# Patient Record
Sex: Female | Born: 1985
Health system: Southern US, Community
[De-identification: ages and names within clinical notes are randomized; demographics above are authoritative.]

## PROBLEM LIST (undated history)

## (undated) DIAGNOSIS — R87629 Unspecified abnormal cytological findings in specimens from vagina: Secondary | ICD-10-CM

## (undated) HISTORY — DX: Unspecified abnormal cytological findings in specimens from vagina: R87.629

## (undated) HISTORY — PX: WISDOM TOOTH EXTRACTION: SHX21

---
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
## 2005-02-01 ENCOUNTER — Ambulatory Visit (HOSPITAL_COMMUNITY): Admission: RE | Admit: 2005-02-01 | Discharge: 2005-02-01 | Payer: Self-pay | Admitting: Obstetrics & Gynecology

## 2005-02-21 ENCOUNTER — Emergency Department (HOSPITAL_COMMUNITY): Admission: EM | Admit: 2005-02-21 | Discharge: 2005-02-21 | Payer: Self-pay | Admitting: Emergency Medicine

## 2005-02-21 ENCOUNTER — Emergency Department (HOSPITAL_COMMUNITY): Admission: EM | Admit: 2005-02-21 | Discharge: 2005-02-22 | Payer: Self-pay | Admitting: Emergency Medicine

## 2005-03-30 ENCOUNTER — Ambulatory Visit (HOSPITAL_COMMUNITY): Admission: RE | Admit: 2005-03-30 | Discharge: 2005-03-30 | Payer: Self-pay | Admitting: Obstetrics & Gynecology

## 2005-05-09 ENCOUNTER — Inpatient Hospital Stay (HOSPITAL_COMMUNITY): Admission: AD | Admit: 2005-05-09 | Discharge: 2005-05-09 | Payer: Self-pay | Admitting: Obstetrics

## 2005-07-03 ENCOUNTER — Inpatient Hospital Stay (HOSPITAL_COMMUNITY): Admission: AD | Admit: 2005-07-03 | Discharge: 2005-07-03 | Payer: Self-pay | Admitting: Obstetrics

## 2005-09-04 ENCOUNTER — Inpatient Hospital Stay (HOSPITAL_COMMUNITY): Admission: AD | Admit: 2005-09-04 | Discharge: 2005-09-04 | Payer: Self-pay | Admitting: Obstetrics

## 2005-09-05 ENCOUNTER — Inpatient Hospital Stay (HOSPITAL_COMMUNITY): Admission: AD | Admit: 2005-09-05 | Discharge: 2005-09-08 | Payer: Self-pay | Admitting: Obstetrics

## 2005-09-10 ENCOUNTER — Inpatient Hospital Stay (HOSPITAL_COMMUNITY): Admission: AD | Admit: 2005-09-10 | Discharge: 2005-09-10 | Payer: Self-pay | Admitting: Obstetrics

## 2006-12-04 ENCOUNTER — Inpatient Hospital Stay (HOSPITAL_COMMUNITY): Admission: AD | Admit: 2006-12-04 | Discharge: 2006-12-04 | Payer: Self-pay | Admitting: Obstetrics

## 2007-09-09 ENCOUNTER — Emergency Department (HOSPITAL_COMMUNITY): Admission: EM | Admit: 2007-09-09 | Discharge: 2007-09-09 | Payer: Self-pay | Admitting: Emergency Medicine

## 2008-04-26 ENCOUNTER — Emergency Department (HOSPITAL_COMMUNITY): Admission: EM | Admit: 2008-04-26 | Discharge: 2008-04-26 | Payer: Self-pay | Admitting: Emergency Medicine

## 2008-06-16 ENCOUNTER — Emergency Department (HOSPITAL_COMMUNITY): Admission: EM | Admit: 2008-06-16 | Discharge: 2008-06-16 | Payer: Self-pay | Admitting: Emergency Medicine

## 2008-09-19 ENCOUNTER — Emergency Department (HOSPITAL_COMMUNITY): Admission: EM | Admit: 2008-09-19 | Discharge: 2008-09-19 | Payer: Self-pay | Admitting: Emergency Medicine

## 2008-11-22 ENCOUNTER — Emergency Department (HOSPITAL_COMMUNITY): Admission: EM | Admit: 2008-11-22 | Discharge: 2008-11-22 | Payer: Self-pay | Admitting: Emergency Medicine

## 2009-02-25 ENCOUNTER — Inpatient Hospital Stay (HOSPITAL_COMMUNITY): Admission: AD | Admit: 2009-02-25 | Discharge: 2009-02-25 | Payer: Self-pay | Admitting: Obstetrics & Gynecology

## 2009-04-14 ENCOUNTER — Ambulatory Visit (HOSPITAL_COMMUNITY): Admission: RE | Admit: 2009-04-14 | Discharge: 2009-04-14 | Payer: Self-pay | Admitting: Obstetrics

## 2009-06-15 ENCOUNTER — Inpatient Hospital Stay (HOSPITAL_COMMUNITY): Admission: AD | Admit: 2009-06-15 | Discharge: 2009-06-15 | Payer: Self-pay | Admitting: Obstetrics and Gynecology

## 2009-06-23 ENCOUNTER — Encounter: Admission: RE | Admit: 2009-06-23 | Discharge: 2009-06-23 | Payer: Self-pay | Admitting: Obstetrics and Gynecology

## 2009-08-24 ENCOUNTER — Inpatient Hospital Stay (HOSPITAL_COMMUNITY): Admission: AD | Admit: 2009-08-24 | Discharge: 2009-08-26 | Payer: Self-pay | Admitting: Obstetrics and Gynecology

## 2010-03-13 ENCOUNTER — Encounter: Payer: Self-pay | Admitting: Obstetrics & Gynecology

## 2010-03-14 ENCOUNTER — Encounter: Payer: Self-pay | Admitting: Obstetrics and Gynecology

## 2010-05-09 LAB — CBC
HCT: 35.3 % — ABNORMAL LOW (ref 36.0–46.0)
Hemoglobin: 12.1 g/dL (ref 12.0–15.0)
MCH: 30.9 pg (ref 26.0–34.0)
MCHC: 34.5 g/dL (ref 30.0–36.0)
MCV: 90.1 fL (ref 78.0–100.0)
Platelets: 195 10*3/uL (ref 150–400)
RBC: 3.6 MIL/uL — ABNORMAL LOW (ref 3.87–5.11)
RBC: 3.92 MIL/uL (ref 3.87–5.11)
RDW: 12.9 % (ref 11.5–15.5)
RDW: 13.3 % (ref 11.5–15.5)
WBC: 8.8 10*3/uL (ref 4.0–10.5)

## 2010-05-09 LAB — URINALYSIS, ROUTINE W REFLEX MICROSCOPIC
Hgb urine dipstick: NEGATIVE
Ketones, ur: NEGATIVE mg/dL
Nitrite: NEGATIVE
Protein, ur: NEGATIVE mg/dL
Specific Gravity, Urine: 1.015 (ref 1.005–1.030)
pH: 8 (ref 5.0–8.0)

## 2010-05-09 LAB — WET PREP, GENITAL

## 2010-05-09 LAB — GC/CHLAMYDIA PROBE AMP, GENITAL: Chlamydia, DNA Probe: NEGATIVE

## 2010-05-11 LAB — URINE CULTURE

## 2010-05-11 LAB — URINALYSIS, ROUTINE W REFLEX MICROSCOPIC
Bilirubin Urine: NEGATIVE
Glucose, UA: NEGATIVE mg/dL
Specific Gravity, Urine: >1.03 — ABNORMAL HIGH (ref 1.005–1.030)
Urobilinogen, UA: 0.2 mg/dL (ref 0.0–1.0)

## 2010-05-11 LAB — URINE MICROSCOPIC-ADD ON

## 2010-05-27 LAB — URINALYSIS, ROUTINE W REFLEX MICROSCOPIC
Nitrite: NEGATIVE
Specific Gravity, Urine: 1.022 (ref 1.005–1.030)
Urobilinogen, UA: 4 mg/dL — ABNORMAL HIGH (ref 0.0–1.0)

## 2010-05-27 LAB — URINE CULTURE: Colony Count: 4000

## 2010-05-27 LAB — POCT PREGNANCY, URINE: Preg Test, Ur: NEGATIVE

## 2010-05-27 LAB — URINE MICROSCOPIC-ADD ON

## 2010-06-02 LAB — PREGNANCY, URINE: Preg Test, Ur: NEGATIVE

## 2010-06-02 LAB — POCT I-STAT, CHEM 8
BUN: 17 mg/dL (ref 6–23)
Creatinine, Ser: 0.8 mg/dL (ref 0.4–1.2)

## 2010-07-27 ENCOUNTER — Emergency Department (HOSPITAL_COMMUNITY)
Admission: EM | Admit: 2010-07-27 | Discharge: 2010-07-27 | Disposition: A | Discharge status: Home / Self Care | Payer: Self-pay | Attending: Emergency Medicine | Admitting: Emergency Medicine

## 2010-07-27 ENCOUNTER — Emergency Department (HOSPITAL_COMMUNITY): Payer: Self-pay

## 2010-07-27 DIAGNOSIS — R51 Headache: Secondary | ICD-10-CM | POA: Insufficient documentation

## 2010-07-27 DIAGNOSIS — R42 Dizziness and giddiness: Secondary | ICD-10-CM | POA: Insufficient documentation

## 2010-07-27 LAB — URINE MICROSCOPIC-ADD ON

## 2010-07-27 LAB — URINALYSIS, ROUTINE W REFLEX MICROSCOPIC
Bilirubin Urine: NEGATIVE
Ketones, ur: 15 mg/dL — AB
Leukocytes, UA: NEGATIVE
Nitrite: NEGATIVE
Specific Gravity, Urine: 1.028 (ref 1.005–1.030)
Urobilinogen, UA: 1 mg/dL (ref 0.0–1.0)

## 2010-07-28 LAB — POCT I-STAT, CHEM 8
Calcium, Ion: 1.22 mmol/L (ref 1.12–1.32)
Creatinine, Ser: 0.8 mg/dL (ref 0.4–1.2)
TCO2: 28 mmol/L (ref 0–100)

## 2011-02-13 ENCOUNTER — Encounter: Payer: Self-pay | Admitting: *Deleted

## 2011-02-13 ENCOUNTER — Emergency Department (HOSPITAL_COMMUNITY)
Admission: EM | Admit: 2011-02-13 | Discharge: 2011-02-14 | Disposition: A | Discharge status: Home / Self Care | Payer: Self-pay | Attending: Emergency Medicine | Admitting: Emergency Medicine

## 2011-02-13 DIAGNOSIS — F411 Generalized anxiety disorder: Secondary | ICD-10-CM | POA: Insufficient documentation

## 2011-02-13 DIAGNOSIS — T391X1A Poisoning by 4-Aminophenol derivatives, accidental (unintentional), initial encounter: Secondary | ICD-10-CM | POA: Insufficient documentation

## 2011-02-13 DIAGNOSIS — T50901A Poisoning by unspecified drugs, medicaments and biological substances, accidental (unintentional), initial encounter: Secondary | ICD-10-CM

## 2011-02-13 DIAGNOSIS — R51 Headache: Secondary | ICD-10-CM | POA: Insufficient documentation

## 2011-02-13 LAB — DIFFERENTIAL
Basophils Absolute: 0 10*3/uL (ref 0.0–0.1)
Basophils Relative: 1 % (ref 0–1)
Lymphocytes Relative: 38 % (ref 12–46)
Neutro Abs: 3.7 10*3/uL (ref 1.7–7.7)

## 2011-02-13 LAB — COMPREHENSIVE METABOLIC PANEL
ALT: 9 U/L (ref 0–35)
AST: 14 U/L (ref 0–37)
Albumin: 4.3 g/dL (ref 3.5–5.2)
CO2: 26 mEq/L (ref 19–32)
Calcium: 9.6 mg/dL (ref 8.4–10.5)
Chloride: 102 mEq/L (ref 96–112)
GFR calc non Af Amer: >90 mL/min (ref >90)
Sodium: 138 mEq/L (ref 135–145)

## 2011-02-13 LAB — RAPID URINE DRUG SCREEN, HOSP PERFORMED
Amphetamines: NOT DETECTED
Benzodiazepines: NOT DETECTED
Opiates: POSITIVE — AB
Tetrahydrocannabinol: NOT DETECTED

## 2011-02-13 LAB — POCT I-STAT 3, VENOUS BLOOD GAS (G3P V)
Acid-base deficit: 2 mmol/L (ref 0.0–2.0)
O2 Saturation: 27 %
Patient temperature: 37
TCO2: 25 mmol/L (ref 0–100)
pCO2, Ven: 45.2 mmHg (ref 45.0–50.0)

## 2011-02-13 LAB — CBC
Platelets: 276 10*3/uL (ref 150–400)
RDW: 12.2 % (ref 11.5–15.5)
WBC: 6.6 10*3/uL (ref 4.0–10.5)

## 2011-02-13 MED ORDER — SODIUM CHLORIDE 0.9 % IV BOLUS (SEPSIS)
1000.0000 mL | Freq: Once | INTRAVENOUS | Status: AC
  Administered 2011-02-13: 1000 mL via INTRAVENOUS

## 2011-02-13 NOTE — ED Provider Notes (Signed)
Medical screening examination/treatment/procedure(s) were performed by non-physician practitioner and as supervising physician I was immediately available for consultation/collaboration.   Lyanne Co, MD 02/13/11 [phone]

## 2011-02-13 NOTE — BH Assessment (Signed)
Assessment Note   Janet French is an 25 y.o. female. PT PRESENTS FOR MEDICAL CLEARANCE AFTER TAKING 8 VICODINE CLAIMING SHE WANTED TO GO TO SLEEP. PT HAS A FLAT AFFECT, DENIES CURRENT IDEATION EVENTHOUGH SHE ADMITS TAKING BOYFRIENDS VICODINE AFTER RE-OCCURING DAILY ARGUMENT. PT ADMITS TO DEPRESSION BUT DENIES CURRENTLY OR EVER SEEING SOMEONE. PT DENIES PRIOR ATTEMPTS OR HI OR AV. PT IS NOT MEDICALLY CLEARED & ONCE MEDICALLY CLEARED MIGHT NEED A TELEPSYCH TO DETERMINE DISPOSITION. CLINICIAN IS CONCERNED & BELIEVES THIS WAS A SUICIDE ATTEMPT WHICH PT IS DENYING.  Axis I: Major Depression, single episode Axis II: Deferred Axis III: History reviewed. No pertinent past medical history. Axis IV: other psychosocial or environmental problems, problems related to social environment and problems with primary support group Axis V: 11-20 some danger of hurting self or others possible OR occasionally fails to maintain minimal personal hygiene OR gross impairment in communication  Past Medical History: History reviewed. No pertinent past medical history.  History reviewed. No pertinent past surgical history.  Family History:  Family History  Problem Relation Age of Onset  . Cancer Other     Social History:  reports that she has never smoked. She does not have any smokeless tobacco history on file. She reports that she does not drink alcohol or use illicit drugs.  Additional Social History:    Allergies: No Known Allergies  Home Medications:  Medications Prior to Admission  Medication Dose Route Frequency Provider Last Rate Last Dose  . sodium chloride 0.9 % bolus 1,000 mL  1,000 mL Intravenous Once Arman Filter, NP   1,000 mL at 02/13/11 2201   No current outpatient prescriptions on file as of 02/13/2011.    OB/GYN Status:  No LMP recorded. Patient has had an injection.  General Assessment Data Location of Assessment: [facility] ED ACT Assessment: Yes Living Arrangements: Non-Relatives Can pt  return to current living arrangement?: Yes Admission Status: Voluntary Is patient capable of signing voluntary admission?: Yes Transfer from: Acute Hospital Referral Source: Self/Family/Friend     Risk to self Suicidal Ideation: Yes-Currently Present Suicidal Intent: Yes-Currently Present Is patient at risk for suicide?: Yes Suicidal Plan?: Yes-Currently Present Specify Current Suicidal Plan: TOOK 8 VICODINE Access to Means: Yes Specify Access to Suicidal Means: 8 VICODINES What has been your use of drugs/alcohol within the last 12 months?: NA Previous Attempts/Gestures: No How many times?: 0  Other Self Harm Risks: NA Triggers for Past Attempts: Other personal contacts Intentional Self Injurious Behavior: None Family Suicide History: Unknown Recent stressful life event(s): Conflict (Comment) Persecutory voices/beliefs?: No Depression: Yes Depression Symptoms: Loss of interest in usual pleasures;Feeling worthless/self pity;Isolating Substance abuse history and/or treatment for substance abuse?: No Suicide prevention information given to non-admitted patients: Not applicable  Risk to Others Homicidal Ideation: No Thoughts of Harm to Others: No Current Homicidal Intent: No Current Homicidal Plan: No Access to Homicidal Means: No Identified Victim: NA History of harm to others?: No Assessment of Violence: None Noted Violent Behavior Description: CALM FLAT, COOPERATIVE Does patient have access to weapons?: No Criminal Charges Pending?: No Does patient have a court date: No  Psychosis Hallucinations: None noted Delusions: None noted  Mental Status Report Appear/Hygiene: Improved Eye Contact: Fair Motor Activity: Unremarkable Speech: Soft Level of Consciousness: Alert Mood: Depressed;Anhedonia;Empty Affect: Depressed Anxiety Level: None Thought Processes: Coherent;Relevant Judgement: Unimpaired Orientation: Person;Place;Time;Situation Obsessive Compulsive  Thoughts/Behaviors: None  Cognitive Functioning Concentration: Decreased Memory: Recent Intact;Remote Intact IQ: Average Insight: Poor Impulse Control: Poor Appetite: Poor Weight  Loss: 0  Weight Gain: 0  Sleep: Decreased Total Hours of Sleep: 8  Vegetative Symptoms: None  Prior Inpatient Therapy Prior Inpatient Therapy: No Prior Therapy Dates: NA Prior Therapy Facilty/Provider(s): NA Reason for Treatment: NA  Prior Outpatient Therapy Prior Outpatient Therapy: No Prior Therapy Dates: NA Prior Therapy Facilty/Provider(s): NA Reason for Treatment: NA            Values / Beliefs Cultural Requests During Hospitalization: None Spiritual Requests During Hospitalization: None        Additional Information 1:1 In Past 12 Months?: No CIRT Risk: No Elopement Risk: No Does patient have medical clearance?: No     Disposition:  Disposition Disposition of Patient: Inpatient treatment program;Referred to (NOT MEDICAL CLEARED) Type of inpatient treatment program: Adult  On Site Evaluation by:   Reviewed with Physician:     Waldron Session 02/13/2011 10:01 PM

## 2011-02-13 NOTE — ED Notes (Addendum)
C/o pain everywhere, lists: head, L knee & chest. Onset 1830. Took 8 hydrocodone 5/325 at 1945 to treat pain, intent was to treat pain & "just wanted to go to sleep, but not for ever". "H/o migraines and bad knee". (denies: other drugs, medications ETOH, hallucinations or SI), (denies nv or other sx).

## 2011-02-13 NOTE — ED Notes (Addendum)
[facility] called. Spoke with [place].

## 2011-02-13 NOTE — ED Provider Notes (Addendum)
History     CSN: [id number]  Arrival date & time 02/13/11  2019   First MD Initiated Contact with Patient 02/13/11 2128      Chief Complaint  Patient presents with  . Drug Overdose    took 8 at one time, taken at ~1945, taken for pain everywhere    (Consider location/radiation/quality/duration/timing/severity/associated sxs/prior treatment) HPI Comments: Patient states she took 6-8 Vicodin tablets that were not her room for headache.  Because she wanted it to work faster, makes poor eye contact.  Denies suicidality, states she just wanted to sleep  Patient is a 25 y.o. female presenting with Overdose. The history is provided by the patient.  Drug Overdose This is a new problem. The current episode started today. Associated symptoms include headaches. The symptoms are aggravated by nothing. She has tried nothing for the symptoms. The treatment provided no relief.    History reviewed. No pertinent past medical history.  History reviewed. No pertinent past surgical history.  Family History  Problem Relation Age of Onset  . Cancer Other     History  Substance Use Topics  . Smoking status: Never Smoker   . Smokeless tobacco: Not on file  . Alcohol Use: No    OB History    Grav Para Term Preterm Abortions TAB SAB Ect Mult Living                  Review of Systems  Constitutional: Negative.   Eyes: Negative.   Respiratory: Negative.   Cardiovascular: Negative.   Genitourinary: Negative.   Neurological: Positive for headaches. Negative for dizziness.  Hematological: Negative.   Psychiatric/Behavioral: Negative for suicidal ideas, behavioral problems and agitation. The patient is nervous/anxious.     Allergies  Review of patient's allergies indicates no known allergies.  Home Medications   Current Outpatient Rx  Name Route Sig Dispense Refill  . HYDROCODONE-ACETAMINOPHEN 5-325 MG PO TABS Oral Take 1 tablet by mouth every 6 (six) hours as needed. For pain.     Marland Kitchen  DEPO-PROVERA IM Intramuscular Inject 1 Syringe into the muscle every 30 (thirty) days. Had monthly injection on wed 12/19       BP 125/72  Pulse 92  Temp(Src) 98.3 F (36.8 C) (Oral)  Resp 18  SpO2 100%  Physical Exam  Constitutional: She is oriented to person, place, and time. She appears well-developed and well-nourished.  HENT:  Head: Normocephalic.  Eyes: Pupils are equal, round, and reactive to light.  Cardiovascular: Normal rate.   Pulmonary/Chest: Effort normal.  Musculoskeletal: Normal range of motion.  Neurological: She is oriented to person, place, and time.  Skin: Skin is warm.  Psychiatric: Thought content normal. Cognition and memory are not impaired. She expresses inappropriate judgment. She expresses no suicidal ideation. She expresses no suicidal plans and no homicidal plans. She exhibits normal recent memory.    ED Course  Procedures (including critical care time)  Labs Reviewed  CBC - Abnormal; Notable for the following:    HCT 35.1 (*)    All other components within normal limits  SALICYLATE LEVEL - Abnormal; Notable for the following:    Salicylate Lvl <2.0 (*)    All other components within normal limits  URINE RAPID DRUG SCREEN (HOSP PERFORMED) - Abnormal; Notable for the following:    Opiates POSITIVE (*)    All other components within normal limits  POCT I-STAT 3, BLOOD GAS (G3P V) - Abnormal; Notable for the following:    pH, Ven 7.329 (*)  pO2, Ven 19.0 (*)    All other components within normal limits  DIFFERENTIAL  COMPREHENSIVE METABOLIC PANEL  ACETAMINOPHEN LEVEL  ACETAMINOPHEN LEVEL  BLOOD GAS, VENOUS   No results found.   1. Drug overdose    Tell psych evaluation has been performed psychiatrist called and reports he finds no reason to admit this patien,t feels that she is safe to return to her home.    MDM  We'll check drug levels including Tylenol, per patient history.  She took 6-8 Vicodin tablets with 325 mg Tylenol, each.   Will have patient assess by asked him as well to to her poor eye contact and they history giving  Ms. Turcott was seen and assessed by activity and who feels that this was a suicide attempt.  Reports the patient stated she had an argument with her boyfriend, which preceded the consumption of the boyfriends of Vicodin.  Will transfer patient to [place] with a sitter.  Still awaiting Tylenol and salicylate levels.  After these have returned.  Will consult with [facility] psych for further input.      Arman Filter, NP 02/13/11 2207  Arman Filter, NP 02/13/11 2317  Arman Filter, NP 02/14/11 4540  Arman Filter, NP 02/14/11 [phone]

## 2011-02-14 NOTE — ED Provider Notes (Signed)
Medical screening examination/treatment/procedure(s) were performed by non-physician practitioner and as supervising physician I was immediately available for consultation/collaboration.   Zenia Guest M Miangel Flom, DO 02/14/11 0929 

## 2011-04-30 DIAGNOSIS — R1031 Right lower quadrant pain: Secondary | ICD-10-CM | POA: Insufficient documentation

## 2011-04-30 DIAGNOSIS — N201 Calculus of ureter: Secondary | ICD-10-CM | POA: Insufficient documentation

## 2011-05-01 ENCOUNTER — Emergency Department (HOSPITAL_COMMUNITY)
Admission: EM | Admit: 2011-05-01 | Discharge: 2011-05-01 | Disposition: A | Discharge status: Home / Self Care | Payer: Self-pay | Attending: Emergency Medicine | Admitting: Emergency Medicine

## 2011-05-01 ENCOUNTER — Emergency Department (HOSPITAL_COMMUNITY): Payer: Self-pay

## 2011-05-01 ENCOUNTER — Encounter (HOSPITAL_COMMUNITY): Payer: Self-pay | Admitting: *Deleted

## 2011-05-01 DIAGNOSIS — N201 Calculus of ureter: Secondary | ICD-10-CM

## 2011-05-01 LAB — URINE MICROSCOPIC-ADD ON

## 2011-05-01 LAB — POCT I-STAT, CHEM 8
BUN: 18 mg/dL (ref 6–23)
Calcium, Ion: 1.28 mmol/L (ref 1.12–1.32)
Chloride: 105 mEq/L (ref 96–112)
Creatinine, Ser: 0.8 mg/dL (ref 0.50–1.10)
Glucose, Bld: 106 mg/dL — ABNORMAL HIGH (ref 70–99)
TCO2: 27 mmol/L (ref 0–100)

## 2011-05-01 LAB — URINALYSIS, ROUTINE W REFLEX MICROSCOPIC
Bilirubin Urine: NEGATIVE
Glucose, UA: NEGATIVE mg/dL
Ketones, ur: NEGATIVE mg/dL
Protein, ur: NEGATIVE mg/dL
Urobilinogen, UA: 1 mg/dL (ref 0.0–1.0)

## 2011-05-01 MED ORDER — OXYCODONE-ACETAMINOPHEN 5-325 MG PO TABS: 2.0000 | ORAL_TABLET | ORAL | Status: AC | PRN

## 2011-05-01 MED ORDER — IBUPROFEN 200 MG PO TABS
600.0000 mg | ORAL_TABLET | Freq: Once | ORAL | Status: AC
  Administered 2011-05-01: 600 mg via ORAL

## 2011-05-01 MED ORDER — IBUPROFEN 200 MG PO TABS
ORAL_TABLET | ORAL | Status: AC
  Administered 2011-05-01: 600 mg via ORAL
  Filled 2011-05-01: qty 3

## 2011-05-01 MED ORDER — ONDANSETRON HCL 4 MG PO TABS: 4.0000 mg | ORAL_TABLET | Freq: Four times a day (QID) | ORAL | Status: AC

## 2011-05-01 MED ORDER — IBUPROFEN 800 MG PO TABS: 800.0000 mg | ORAL_TABLET | Freq: Once | ORAL | Status: DC

## 2011-05-01 NOTE — ED Notes (Signed)
Patient is resting comfortably. 

## 2011-05-01 NOTE — ED Provider Notes (Signed)
History     CSN: [id number]  Arrival date & time 04/30/11  2355   First MD Initiated Contact with Patient 05/01/11 0149      Chief Complaint  Patient presents with  . Abdominal Pain  . Nausea  . Emesis    (Consider location/radiation/quality/duration/timing/severity/associated sxs/prior treatment) HPI Right flank pain started around 11 PM. Sharp in quality and not radiating. She has not noticed any blood or urine. She denies any fevers or chills. She's had some nausea but no vomiting. No history of same. No history of kidney stones. No history of gallbladder problems. Had dinner about 4 hours earlier and does not feel like her symptoms are related to eating a hamburger. No known aggravating or alleviating factors. No migrating pain. No back pain. Moderate in severity. On evaluation her pain is improving and she declines any medications. History reviewed. No pertinent past medical history.  History reviewed. No pertinent past surgical history.  Family History  Problem Relation Age of Onset  . Cancer Other     History  Substance Use Topics  . Smoking status: Never Smoker   . Smokeless tobacco: Not on file  . Alcohol Use: Yes     occasionally    OB History    Grav Para Term Preterm Abortions TAB SAB Ect Mult Living                  Review of Systems  Constitutional: Negative for fever and chills.  HENT: Negative for neck pain and neck stiffness.   Eyes: Negative for pain.  Respiratory: Negative for shortness of breath.   Cardiovascular: Negative for chest pain.  Gastrointestinal: Negative for abdominal pain.  Genitourinary: Positive for flank pain. Negative for dysuria.  Musculoskeletal: Negative for back pain.  Skin: Negative for rash.  Neurological: Negative for headaches.  All other systems reviewed and are negative.    Allergies  Review of patient's allergies indicates no known allergies.  Home Medications   Current Outpatient Rx  Name Route Sig Dispense  Refill  . MEDROXYPROGESTERONE ACETATE 150 MG/ML IM SUSP Intramuscular Inject 150 mg into the muscle every 3 (three) months.      BP 141/92  Pulse 85  Temp 98.6 F (37 C)  Resp 20  SpO2 100%  Physical Exam  Constitutional: She is oriented to person, place, and time. She appears well-developed and well-nourished.  HENT:  Head: Normocephalic and atraumatic.  Eyes: Conjunctivae and EOM are normal. Pupils are equal, round, and reactive to light.  Neck: Trachea normal. Neck supple. No thyromegaly present.  Cardiovascular: Normal rate, regular rhythm, S1 normal, S2 normal and normal pulses.     No systolic murmur is present   No diastolic murmur is present  Pulses:      Radial pulses are 2+ on the right side, and 2+ on the left side.  Pulmonary/Chest: Effort normal and breath sounds normal. She has no wheezes. She has no rhonchi. She has no rales. She exhibits no tenderness.  Abdominal: Soft. Normal appearance and bowel sounds are normal. There is no tenderness. There is no CVA tenderness and negative Murphy's sign.       Localizes discomfort to right flank without any reproducible tenderness. No peritonitis.  Musculoskeletal:       BLE:s Calves nontender, no cords or erythema, negative Homans sign  Neurological: She is alert and oriented to person, place, and time. She has normal strength. No cranial nerve deficit or sensory deficit. GCS eye subscore is 4. GCS  verbal subscore is 5. GCS motor subscore is 6.  Skin: Skin is warm and dry. No rash noted. She is not diaphoretic.  Psychiatric: Her speech is normal.       Cooperative and appropriate    ED Course  Procedures (including critical care time)  Labs Reviewed  URINALYSIS, ROUTINE W REFLEX MICROSCOPIC - Abnormal; Notable for the following:    APPearance CLOUDY (*)    Hgb urine dipstick MODERATE (*)    All other components within normal limits  POCT I-STAT, CHEM 8 - Abnormal; Notable for the following:    Glucose, Bld 106 (*)     All other components within normal limits  URINE MICROSCOPIC-ADD ON - Abnormal; Notable for the following:    Squamous Epithelial / LPF FEW (*)    All other components within normal limits  POCT PREGNANCY, URINE   Ct Abdomen Pelvis Wo Contrast  05/01/2011  *RADIOLOGY REPORT*  Clinical Data: Right lower quadrant abdominal pain  CT ABDOMEN AND PELVIS WITHOUT CONTRAST  Technique:  Multidetector CT imaging of the abdomen and pelvis was performed following the standard protocol without intravenous contrast.  Comparison: None.  Findings: Limited images through the lung bases demonstrate no significant appreciable abnormality. The heart size is within normal limits. No pleural or pericardial effusion.  Intra-abdominal organ evaluation is limited without intravenous contrast.  Within this limitation, unremarkable liver, biliary system, spleen, pancreas, adrenal glands.  There is a right nonobstructing renal stone. No hydronephrosis or hydroureter. Unable to follow the ureteral course in their entirety however no calcifications along the expected course.  There is a 3 mm calcification which appears to be layering dependently within the bladder and may have recently passed.  No bowel obstruction.  No CT evidence for colitis.  Normal appendix.  No free intraperitoneal air or fluid.  Uterus and adnexa within normal limits for noncontrast CT.  Trace free fluid within the pelvis is likely physiologic.  No acute osseous abnormality identified.  IMPRESSION: Nonobstructing right renal stone.  No hydronephrosis or hydroureter.  There is a 3 mm stone within the bladder, which may have recently passed.  Normal appendix.  Original Report Authenticated By: Waneta Martins, M.D.    Ibuprofen provided by request. He'll exams no peritonitis    MDM   Right flank pain with UA reviewed as above hematuria noted. CT scan ordered and reviewed as above with right ureteral stone passed into the bladder. Recheck at 7:10 AM is  feeling much better and feels comes will be discharged home. Urology referral provided as needed. Reliable historian verbalizes understanding return precautions.         Sunnie Nielsen, MD 05/01/11 [phone]

## 2011-05-01 NOTE — Discharge Instructions (Signed)
Ureteral Colic (Kidney Stones) Ureteral colic is the result of a condition when kidney stones form inside the kidney. Once kidney stones are formed they may move into the tube that connects the kidney with the bladder (ureter). If this occurs, this condition may cause pain (colic) in the ureter.  CAUSES  Pain is caused by stone movement in the ureter and the obstruction caused by the stone. SYMPTOMS  The pain comes and goes as the ureter contracts around the stone. The pain is usually intense, sharp, and stabbing in character. The location of the pain may move as the stone moves through the ureter. When the stone is near the kidney the pain is usually located in the back and radiates to the belly (abdomen). When the stone is ready to pass into the bladder the pain is often located in the lower abdomen on the side the stone is located. At this location, the symptoms may mimic those of a urinary tract infection with urinary frequency. Once the stone is located here it often passes into the bladder and the pain disappears completely. TREATMENT   Your caregiver will provide you with medicine for pain relief.   You may require specialized follow-up X-rays.   The absence of pain does not always mean that the stone has passed. It may have just stopped moving. If the urine remains completely obstructed, it can cause loss of kidney function or even complete destruction of the involved kidney. It is your responsibility and in your interest that X-rays and follow-ups as suggested by your caregiver are completed. Relief of pain without passage of the stone can be associated with severe damage to the kidney, including loss of kidney function on that side.   If your stone does not pass on its own, additional measures may be taken by your caregiver to ensure its removal.  HOME CARE INSTRUCTIONS   Increase your fluid intake. Water is the preferred fluid since juices containing vitamin C may acidify the urine making  it less likely for certain stones (uric acid stones) to pass.   Strain all urine. A strainer will be provided. Keep all particulate matter or stones for your caregiver to inspect.   Take your pain medicine as directed.   Make a follow-up appointment with your caregiver as directed.   Remember that the goal is passage of your stone. The absence of pain does not mean the stone is gone. Follow your caregiver's instructions.   Only take over-the-counter or prescription medicines for pain, discomfort, or fever as directed by your caregiver.  SEEK MEDICAL CARE IF:   Pain cannot be controlled with the prescribed medicine.   You have a fever.   Pain continues for longer than your caregiver advises it should.   There is a change in the pain, and you develop chest discomfort or constant abdominal pain.   You feel faint or pass out.  MAKE SURE YOU:   Understand these instructions.   Will watch your condition.   Will get help right away if you are not doing well or get worse.  Document Released: 11/17/2004 Document Revised: 01/27/2011 Document Reviewed: 08/04/2010 [employer] Patient Information 2012 [employer]. 

## 2011-05-01 NOTE — ED Notes (Signed)
Pt states that she has had nausea and abdominal cramping x 2 hours.  Pt denies vomitus or diarrhea.  Pt denies other symptoms and states that her appetite is per her norm.

## 2011-05-01 NOTE — ED Notes (Signed)
Pt c/o nausea starting at 2300, dry heaves. Sharp abdominal pain in RLQ at same time.

## 2011-09-26 ENCOUNTER — Encounter (HOSPITAL_COMMUNITY): Payer: Self-pay

## 2011-09-26 ENCOUNTER — Emergency Department (HOSPITAL_COMMUNITY)
Admission: EM | Admit: 2011-09-26 | Discharge: 2011-09-26 | Disposition: A | Discharge status: Home / Self Care | Payer: Self-pay | Attending: Emergency Medicine | Admitting: Emergency Medicine

## 2011-09-26 DIAGNOSIS — J020 Streptococcal pharyngitis: Secondary | ICD-10-CM | POA: Insufficient documentation

## 2011-09-26 DIAGNOSIS — Z79899 Other long term (current) drug therapy: Secondary | ICD-10-CM | POA: Insufficient documentation

## 2011-09-26 MED ORDER — PENICILLIN G BENZATHINE 1200000 UNIT/2ML IM SUSP
1.2000 10*6.[IU] | Freq: Once | INTRAMUSCULAR | Status: AC
  Administered 2011-09-26: 1.2 10*6.[IU] via INTRAMUSCULAR
  Filled 2011-09-26: qty 2

## 2011-09-26 NOTE — ED Notes (Signed)
ongong sore throat and body aches since Friday.

## 2011-09-26 NOTE — ED Provider Notes (Signed)
Medical screening examination/treatment/procedure(s) were performed by non-physician practitioner and as supervising physician I was immediately available for consultation/collaboration.   Rolan Bucco, MD 09/26/11 [phone]

## 2011-09-26 NOTE — ED Provider Notes (Signed)
History     CSN: [id number]  Arrival date & time 09/26/11  1155   First MD Initiated Contact with Patient 09/26/11 1310      Chief Complaint  Patient presents with  . Sore Throat    (Consider location/radiation/quality/duration/timing/severity/associated sxs/prior treatment) Patient is a 26 y.o. female presenting with pharyngitis. The history is provided by the patient.  Sore Throat This is a new problem. The current episode started in the past 7 days. The problem occurs constantly.  +Assoc chills (no measured fever), ear fullness, myalgias, swollen glands in the neck. - assoc nasal congestion, cough, SOB, CP. Swallowing makes pain worse. No alleviating factors. Tx at home with tylenol without relief.  No past medical history on file.  No past surgical history on file.  Family History  Problem Relation Age of Onset  . Cancer Other     History  Substance Use Topics  . Smoking status: Never Smoker   . Smokeless tobacco: Not on file  . Alcohol Use: Yes     occasionally     Review of Systems Pertinent positives and negatives are reviewed in the HPI. Allergies  Review of patient's allergies indicates no known allergies.  Home Medications   Current Outpatient Rx  Name Route Sig Dispense Refill  . MEDROXYPROGESTERONE ACETATE 150 MG/ML IM SUSP Intramuscular Inject 150 mg into the muscle every 3 (three) months. Due on August 30th      BP 123/84  Pulse 91  Temp 98.9 F (37.2 C) (Oral)  Resp 18  SpO2 100%  Physical Exam  Nursing note and vitals reviewed. Constitutional: She appears well-developed and well-nourished.       Uncomfortable appearing.  HENT:  Head: Normocephalic and atraumatic. No trismus in the jaw.  Nose: Nose normal.  Mouth/Throat: Mucous membranes are normal. No uvula swelling. Oropharyngeal exudate, posterior oropharyngeal edema and posterior oropharyngeal erythema present. No tonsillar abscesses.       Bilateral external ears, canals, TM normal.    Eyes: Conjunctivae are normal.  Neck: Neck supple.       No meningismus. +anterior cervical LAD  Cardiovascular: Normal rate, regular rhythm and normal heart sounds.   Pulmonary/Chest: Effort normal and breath sounds normal. No respiratory distress. She has no wheezes.  Neurological: She is alert.  Skin: Skin is warm and dry.    ED Course  Procedures (including critical care time)  Labs Reviewed  RAPID STREP SCREEN - Abnormal; Notable for the following:    Streptococcus, Group A Screen (Direct) POSITIVE (*)     All other components within normal limits   No results found.  Dx 1: Strep pharyngitis   MDM  Sore throat. + strep screen with 3/4 centor criteria. Pt elects to have single Bicillin injection rather than 10 day PO abx regimen, risks of injection medication discussed. Advised motrin for fever, pain and benzocaine throat lozenges/salt water gargles PRN.        Shaaron Adler, PA-C 09/26/11 1323

## 2012-01-10 ENCOUNTER — Encounter (HOSPITAL_COMMUNITY): Payer: Self-pay

## 2012-01-10 ENCOUNTER — Emergency Department (HOSPITAL_COMMUNITY)
Admission: EM | Admit: 2012-01-10 | Discharge: 2012-01-10 | Disposition: A | Discharge status: Home / Self Care | Payer: Self-pay | Attending: Emergency Medicine | Admitting: Emergency Medicine

## 2012-01-10 DIAGNOSIS — H5710 Ocular pain, unspecified eye: Secondary | ICD-10-CM | POA: Insufficient documentation

## 2012-01-10 DIAGNOSIS — M2550 Pain in unspecified joint: Secondary | ICD-10-CM | POA: Insufficient documentation

## 2012-01-10 DIAGNOSIS — R51 Headache: Secondary | ICD-10-CM | POA: Insufficient documentation

## 2012-01-10 DIAGNOSIS — R52 Pain, unspecified: Secondary | ICD-10-CM | POA: Insufficient documentation

## 2012-01-10 DIAGNOSIS — M791 Myalgia, unspecified site: Secondary | ICD-10-CM

## 2012-01-10 LAB — BASIC METABOLIC PANEL
BUN: 8 mg/dL (ref 6–23)
Chloride: 101 mEq/L (ref 96–112)
Creatinine, Ser: 0.62 mg/dL (ref 0.50–1.10)
GFR calc Af Amer: >90 mL/min (ref >90)
Glucose, Bld: 93 mg/dL (ref 70–99)

## 2012-01-10 LAB — URINALYSIS, ROUTINE W REFLEX MICROSCOPIC
Bilirubin Urine: NEGATIVE
Glucose, UA: NEGATIVE mg/dL
Ketones, ur: NEGATIVE mg/dL
pH: 7 (ref 5.0–8.0)

## 2012-01-10 LAB — CBC WITH DIFFERENTIAL/PLATELET
Basophils Relative: 0 % (ref 0–1)
HCT: 35.4 % — ABNORMAL LOW (ref 36.0–46.0)
Hemoglobin: 12 g/dL (ref 12.0–15.0)
Lymphs Abs: 2 10*3/uL (ref 0.7–4.0)
MCH: 28.8 pg (ref 26.0–34.0)
MCHC: 33.9 g/dL (ref 30.0–36.0)
Monocytes Absolute: 0.5 10*3/uL (ref 0.1–1.0)
Monocytes Relative: 7 % (ref 3–12)
Neutro Abs: 4.5 10*3/uL (ref 1.7–7.7)
RBC: 4.16 MIL/uL (ref 3.87–5.11)

## 2012-01-10 LAB — URINE MICROSCOPIC-ADD ON

## 2012-01-10 MED ORDER — HYDROCODONE-ACETAMINOPHEN 5-325 MG PO TABS
2.0000 | ORAL_TABLET | Freq: Once | ORAL | Status: AC
  Administered 2012-01-10: 2 via ORAL
  Filled 2012-01-10: qty 2

## 2012-01-10 MED ORDER — HYDROCODONE-ACETAMINOPHEN 5-500 MG PO TABS: 1.0000 | ORAL_TABLET | Freq: Four times a day (QID) | ORAL | Status: DC | PRN

## 2012-01-10 NOTE — ED Provider Notes (Signed)
Medical screening examination/treatment/procedure(s) were performed by non-physician practitioner and as supervising physician I was immediately available for consultation/collaboration.   Gleason Ardoin B. Bernette Mayers, MD 01/10/12 Barry Brunner

## 2012-01-10 NOTE — ED Provider Notes (Signed)
History     CSN: [id number]  Arrival date & time 01/10/12  1250   First MD Initiated Contact with Patient 01/10/12 1711      Chief Complaint  Patient presents with  . Headache    (Consider location/radiation/quality/duration/timing/severity/associated sxs/prior treatment) The history is provided by the patient and medical records.    Janet French is a 26 y.o. female presents to the ER c/o general body aches which began approximately 3 AM last night.  Pt has associated headache.  Headache is described as similar to her migraines with sharp shooting pain in her L eye, nonradiating, of gradual onset.  Pt took Excedrine Migrane this AM without relief. Nothing makes the headache or body aches better, movement makes the headache and bodyaches worse.  The generalized bodyaches began gradually overnight, have been persistent and gradually worsened.  Pt denies fever, chills, photophobia, changes in vision, chest pain, shortness of breath, abdominal pain, nausea, vomiting, diarrhea, weakness, dizziness, syncopal episode. 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  Patient denies fever but has not taken her temperature.    History reviewed. No pertinent past medical history.  Past Surgical History  Procedure Date  . Wisdom tooth extraction     Family History  Problem Relation Age of Onset  . Cancer Other     History  Substance Use Topics  . Smoking status: Never Smoker   . Smokeless tobacco: Not on file  . Alcohol Use: Yes     Comment: occasionally    OB History    Grav Para Term Preterm Abortions TAB SAB Ect Mult Living                  Review of Systems  Constitutional: Negative for fever, diaphoresis, appetite change, fatigue and unexpected weight change.  HENT: Negative for mouth sores, neck pain and neck stiffness.   Eyes: Negative for visual disturbance.  Respiratory: Negative for cough, chest tightness, shortness of breath and wheezing.     Cardiovascular: Negative for chest pain.  Gastrointestinal: Negative for nausea, vomiting, abdominal pain, diarrhea and constipation.  Genitourinary: Negative for dysuria, urgency, frequency and hematuria.  Musculoskeletal: Positive for myalgias and arthralgias. Negative for back pain.  Skin: Negative for rash.  Neurological: Positive for headaches. Negative for syncope and light-headedness.  Psychiatric/Behavioral: Negative for sleep disturbance. The patient is not nervous/anxious.   All other systems reviewed and are negative.    Allergies  Review of patient's allergies indicates no known allergies.  Home Medications   Current Outpatient Rx  Name  Route  Sig  Dispense  Refill  . MEDROXYPROGESTERONE ACETATE 150 MG/ML IM SUSP   Intramuscular   Inject 150 mg into the muscle every 3 (three) months. Due on November 25th         . HYDROCODONE-ACETAMINOPHEN 5-500 MG PO TABS   Oral   Take 1-2 tablets by mouth every 6 (six) hours as needed for pain.   10 tablet   0     BP 122/80  Pulse 80  Temp 98.3 F (36.8 C) (Oral)  Resp 16  SpO2 100%  Physical Exam  Nursing note and vitals reviewed. Constitutional: She is oriented to person, place, and time. She appears well-developed and well-nourished. No distress.  HENT:  Head: Normocephalic and atraumatic.  Right Ear: Tympanic membrane, external ear and ear canal normal.  Left Ear: Tympanic membrane, external ear and ear canal normal.  Nose: Nose normal. Right sinus exhibits no maxillary sinus tenderness  and no frontal sinus tenderness. Left sinus exhibits no maxillary sinus tenderness and no frontal sinus tenderness.  Mouth/Throat: Uvula is midline, oropharynx is clear and moist and mucous membranes are normal. No oropharyngeal exudate.  Eyes: Conjunctivae normal and EOM are normal. Pupils are equal, round, and reactive to light. No scleral icterus.  Neck: Normal range of motion. Neck supple.       Full range of motion without  pain Negative Kernig and Brudzinski's  Cardiovascular: Normal rate, regular rhythm, normal heart sounds and intact distal pulses.  Exam reveals no gallop and no friction rub.   No murmur heard. Pulmonary/Chest: Effort normal and breath sounds normal. No respiratory distress. She has no wheezes. She exhibits no tenderness.  Abdominal: Soft. Bowel sounds are normal. She exhibits no mass. There is no tenderness. There is no rebound and no guarding.  Musculoskeletal: Normal range of motion. She exhibits no edema and no tenderness.       Mild, aching pain on range of motion of large joints No pain to palpation of bilateral hands, wrists, elbows,  shoulders feet, ankles, knees, hips  Lymphadenopathy:    She has no cervical adenopathy.  Neurological: She is alert and oriented to person, place, and time. She exhibits normal muscle tone. Coordination normal.       Speech is clear and goal oriented, follows commands Major Cranial nerves without deficit,  Normal strength in upper and lower extremities bilaterally including dorsiflexion and plantar flexion, strong and equal grip strength Sensation normal to light and sharp touch Moves extremities without ataxia, coordination intact Normal gait and balance  Skin: Skin is warm and dry. No rash noted. She is not diaphoretic.  Psychiatric: She has a normal mood and affect.    ED Course  Procedures (including critical care time)  Labs Reviewed  URINALYSIS, ROUTINE W REFLEX MICROSCOPIC - Abnormal; Notable for the following:    APPearance HAZY (*)     Hgb urine dipstick TRACE (*)     Leukocytes, UA MODERATE (*)     All other components within normal limits  CBC WITH DIFFERENTIAL - Abnormal; Notable for the following:    HCT 35.4 (*)     All other components within normal limits  URINE MICROSCOPIC-ADD ON - Abnormal; Notable for the following:    Squamous Epithelial / LPF MANY (*)     Bacteria, UA FEW (*)     All other components within normal limits   BASIC METABOLIC PANEL  POCT PREGNANCY, URINE  URINE CULTURE    Results for orders placed during the hospital encounter of 01/10/12  URINALYSIS, ROUTINE W REFLEX MICROSCOPIC      Component Value Range   Color, Urine YELLOW  YELLOW   APPearance HAZY (*) CLEAR   Specific Gravity, Urine 1.013  1.005 - 1.030   pH 7.0  5.0 - 8.0   Glucose, UA NEGATIVE  NEGATIVE mg/dL   Hgb urine dipstick TRACE (*) NEGATIVE   Bilirubin Urine NEGATIVE  NEGATIVE   Ketones, ur NEGATIVE  NEGATIVE mg/dL   Protein, ur NEGATIVE  NEGATIVE mg/dL   Urobilinogen, UA 1.0  0.0 - 1.0 mg/dL   Nitrite NEGATIVE  NEGATIVE   Leukocytes, UA MODERATE (*) NEGATIVE  CBC WITH DIFFERENTIAL      Component Value Range   WBC 7.0  4.0 - 10.5 K/uL   RBC 4.16  3.87 - 5.11 MIL/uL   Hemoglobin 12.0  12.0 - 15.0 g/dL   HCT 16.1 (*) 09.6 - 04.5 %  MCV 85.1  78.0 - 100.0 fL   MCH 28.8  26.0 - 34.0 pg   MCHC 33.9  30.0 - 36.0 g/dL   RDW 13.2  44.0 - 10.2 %   Platelets 251  150 - 400 K/uL   Neutrophils Relative 65  43 - 77 %   Neutro Abs 4.5  1.7 - 7.7 K/uL   Lymphocytes Relative 28  12 - 46 %   Lymphs Abs 2.0  0.7 - 4.0 K/uL   Monocytes Relative 7  3 - 12 %   Monocytes Absolute 0.5  0.1 - 1.0 K/uL   Eosinophils Relative 0  0 - 5 %   Eosinophils Absolute 0.0  0.0 - 0.7 K/uL   Basophils Relative 0  0 - 1 %   Basophils Absolute 0.0  0.0 - 0.1 K/uL  BASIC METABOLIC PANEL      Component Value Range   Sodium 139  135 - 145 mEq/L   Potassium 3.5  3.5 - 5.1 mEq/L   Chloride 101  96 - 112 mEq/L   CO2 29  19 - 32 mEq/L   Glucose, Bld 93  70 - 99 mg/dL   BUN 8  6 - 23 mg/dL   Creatinine, Ser 7.25  0.50 - 1.10 mg/dL   Calcium 9.7  8.4 - 36.6 mg/dL   GFR calc non Af Amer >90  >90 mL/min   GFR calc Af Amer >90  >90 mL/min  POCT PREGNANCY, URINE      Component Value Range   Preg Test, Ur NEGATIVE  NEGATIVE  URINE MICROSCOPIC-ADD ON      Component Value Range   Squamous Epithelial / LPF MANY (*) RARE   WBC, UA 3-6  <3 WBC/hpf    RBC / HPF 0-2  <3 RBC/hpf   Bacteria, UA FEW (*) RARE   No results found.   No results found.   1. Myalgia   2. Headache       MDM  Netta Neat presents with headache and generalized body aches.  Patient remains afebrile, non-tachycardic, nonseptic, nontoxic appearing.  Pt HA treated and improved while in ED.  Presentation is like pts typical HA and non concerning for [facility], ICH, Meningitis, or temporal arteritis. Pt is afebrile with no focal neuro deficits, nuchal rigidity, or change in vision. BMP, CBC unremarkable. Pregnancy negative. Urinalysis with contamination, urine culture sent. Generalized bodyaches likely secondary to viral syndrome as patient has no focal complaints.  I discussed this with the patient as well as useful symptomatic treatments for viral illness including the flu.  I have also discussed reasons to return immediately to the ER.  Patient expresses understanding and agrees with plan.  1. Medications: Tylenol/ibuprofen for fever or generalized aches and pains; Vicodin for breakthrough pain or migraine headache pain 2. Treatment: Rest, drink plenty of fluids, treat fever if it develops, use over-the-counter Mucinex, congestion, cough and cold medications if further symptoms develop 3. Follow Up: With primary care physician if symptoms persist         Dierdre Forth, PA-C 01/10/12 1915

## 2012-01-10 NOTE — ED Notes (Signed)
Pt complains of severe neck pain adn headache

## 2012-01-10 NOTE — ED Notes (Addendum)
C/o upper back pain last night worsened with mvmt. Today awoke with generalized body aches & h/a. States pain in head located above left eye. Denies cold, cough, core throat, nasal congestion, n/v/d, fever. Took an excedrin migraine this morning at 1000 without relief. Left work early due to pain & drove self here to ED.  Pain worse with any mvmt, sitting, lying. Denies dizziness, photophobia.

## 2012-01-10 NOTE — ED Notes (Signed)
Pt is here with all over body aches.  Pt states cannot get comfortable.

## 2012-01-10 NOTE — ED Notes (Signed)
Pt reports sick neck pain

## 2012-01-11 LAB — URINE CULTURE

## 2012-03-29 ENCOUNTER — Emergency Department (HOSPITAL_COMMUNITY): Payer: Self-pay

## 2012-03-29 ENCOUNTER — Encounter (HOSPITAL_COMMUNITY): Payer: Self-pay | Admitting: Emergency Medicine

## 2012-03-29 ENCOUNTER — Emergency Department (HOSPITAL_COMMUNITY)
Admission: EM | Admit: 2012-03-29 | Discharge: 2012-03-29 | Disposition: A | Discharge status: Home / Self Care | Payer: Self-pay | Attending: Emergency Medicine | Admitting: Emergency Medicine

## 2012-03-29 DIAGNOSIS — R51 Headache: Secondary | ICD-10-CM | POA: Insufficient documentation

## 2012-03-29 DIAGNOSIS — R058 Other specified cough: Secondary | ICD-10-CM

## 2012-03-29 DIAGNOSIS — Z79899 Other long term (current) drug therapy: Secondary | ICD-10-CM | POA: Insufficient documentation

## 2012-03-29 DIAGNOSIS — J029 Acute pharyngitis, unspecified: Secondary | ICD-10-CM | POA: Insufficient documentation

## 2012-03-29 DIAGNOSIS — R059 Cough, unspecified: Secondary | ICD-10-CM | POA: Insufficient documentation

## 2012-03-29 DIAGNOSIS — R05 Cough: Secondary | ICD-10-CM | POA: Insufficient documentation

## 2012-03-29 MED ORDER — HYDROCODONE-HOMATROPINE 5-1.5 MG/5ML PO SYRP: 5.0000 mL | ORAL_SOLUTION | Freq: Four times a day (QID) | ORAL | Status: DC | PRN

## 2012-03-29 NOTE — ED Provider Notes (Signed)
History     CSN: [id number]  Arrival date & time 03/29/12  2143   First MD Initiated Contact with Patient 03/29/12 2256      Chief Complaint  Patient presents with  . Cough    (Consider location/radiation/quality/duration/timing/severity/associated sxs/prior treatment) Patient is a 27 y.o. female presenting with cough. The history is provided by the patient. No language interpreter was used.  Cough This is a recurrent problem. The current episode started more than 2 days ago. The problem occurs every few minutes. The problem has not changed since onset.The cough is non-productive. There has been no fever. Associated symptoms include headaches and sore throat. Pertinent negatives include no chest pain, no chills, no sweats, no ear congestion, no ear pain, no rhinorrhea, no myalgias, no shortness of breath, no wheezing and no eye redness. She has tried decongestants and cough syrup (tea with honey and lemon) for the symptoms. The treatment provided no relief. She is not a smoker. Her past medical history does not include pneumonia, bronchiectasis or emphysema.    History reviewed. No pertinent past medical history.  Past Surgical History  Procedure Date  . Wisdom tooth extraction     Family History  Problem Relation Age of Onset  . Cancer Other     History  Substance Use Topics  . Smoking status: Never Smoker   . Smokeless tobacco: Not on file  . Alcohol Use: Yes     Comment: occasionally    OB History    Grav Para Term Preterm Abortions TAB SAB Ect Mult Living                  Review of Systems  Constitutional: Negative for chills.  HENT: Positive for sore throat. Negative for ear pain and rhinorrhea.   Eyes: Negative for redness.  Respiratory: Positive for cough. Negative for shortness of breath and wheezing.   Cardiovascular: Negative for chest pain.  Musculoskeletal: Negative for myalgias.  Neurological: Positive for headaches.    Allergies  Review of  patient's allergies indicates no known allergies.  Home Medications   Current Outpatient Rx  Name  Route  Sig  Dispense  Refill  . MUCINEX DM MAXIMUM STRENGTH PO   Oral   Take 20 mLs by mouth 2 (two) times daily as needed. For cough and congestion         . ROBITUSSIN NIGHT TIME CGH/COLD PO   Oral   Take 5 mLs by mouth at bedtime as needed. For cold symptoms         . NYQUIL PO   Oral   Take 20 mLs by mouth at bedtime as needed. For cold symptoms         . HYDROCODONE-HOMATROPINE 5-1.5 MG/5ML PO SYRP   Oral   Take 5 mLs by mouth every 6 (six) hours as needed for cough.   120 mL   0   . MEDROXYPROGESTERONE ACETATE 150 MG/ML IM SUSP   Intramuscular   Inject 150 mg into the muscle every 3 (three) months. Last shot March 22, 2012           BP 127/79  Pulse 92  Temp 99.1 F (37.3 C) (Oral)  Resp 16  SpO2 100%  LMP 03/29/2012  Physical Exam  Nursing note and vitals reviewed. Constitutional: She is oriented to person, place, and time. She appears well-developed and well-nourished. No distress.  HENT:  Head: Normocephalic and atraumatic.  Mouth/Throat: Uvula is midline. Normal dentition. No dental abscesses or uvula  swelling. Posterior oropharyngeal erythema present. No oropharyngeal exudate, posterior oropharyngeal edema or tonsillar abscesses.  Eyes: Conjunctivae normal are normal. Pupils are equal, round, and reactive to light. No scleral icterus.  Neck: Normal range of motion.  Cardiovascular: Normal rate, regular rhythm and normal heart sounds.   Pulmonary/Chest: Effort normal.  Musculoskeletal: Normal range of motion.  Lymphadenopathy:    She has no cervical adenopathy.  Neurological: She is alert and oriented to person, place, and time.  Skin: Skin is warm. No rash noted. She is not diaphoretic. No erythema. No pallor.  Psychiatric: She has a normal mood and affect. Her behavior is normal.    ED Course  Procedures (including critical care  time)  Labs Reviewed - No data to display Dg Chest 2 View  03/29/2012  *RADIOLOGY REPORT*  Clinical Data: Cough for past week.  CHEST - 2 VIEW  Comparison: None.  Findings: Mild central pulmonary vascular prominence with minimal peribronchial thickening without segmental infiltrate.  No pneumothorax.  Heart size within normal limits.  IMPRESSION: Mild central pulmonary vascular prominence with minimal peribronchial thickening without segmental infiltrate.   Original Report Authenticated By: Lacy Duverney, M.D.      1. Dry cough   2. Pharyngitis       MDM  Patient presents with dry cough x 1 week that is intermittent and unchanged since onset. Patient has tried multiple remedies without relief. Also has headaches that come and go secondary to coughing spells. Denies fever, other URI symptoms, SOB and chest pain. Patient is well appearing in ED today and her lungs are clear on exam. CXR shows minimal peribronchial thickening and no signs of pneumonia. Will treat with hycodan for symptomatic relief and have instructed patient to take ibuprofen for headache relief. Patient should return to ED if symptoms worsen or difficulty breathing develops.        Antony Madura, PA-C 03/30/12 0001

## 2012-03-29 NOTE — ED Notes (Signed)
PT. REPORTS PERSISTENT PRODUCTIVE COUGH FOR SEVERAL DAYS UNRELIEVED BY OTC COUGH REMEDIES.

## 2012-03-29 NOTE — ED Notes (Signed)
Pt presents today with productive cough that started last Thursday.  Pt states that OTC medication has not worked.

## 2012-03-30 NOTE — ED Provider Notes (Signed)
Medical screening examination/treatment/procedure(s) were performed by non-physician practitioner and as supervising physician I was immediately available for consultation/collaboration.  Juliet Rude. Rubin Payor, MD 03/30/12 1436

## 2012-04-16 ENCOUNTER — Encounter (HOSPITAL_COMMUNITY): Payer: Self-pay | Admitting: *Deleted

## 2012-04-24 ENCOUNTER — Ambulatory Visit (HOSPITAL_COMMUNITY)
Admission: RE | Admit: 2012-04-24 | Discharge: 2012-04-24 | Disposition: A | Discharge status: Home / Self Care | Payer: Self-pay | Source: Ambulatory Visit | Attending: Obstetrics and Gynecology | Admitting: Obstetrics and Gynecology

## 2012-04-24 ENCOUNTER — Encounter (HOSPITAL_COMMUNITY): Payer: Self-pay

## 2012-04-24 ENCOUNTER — Other Ambulatory Visit: Payer: Self-pay | Admitting: Obstetrics and Gynecology

## 2012-04-24 VITALS — BP 102/84 | Temp 98.9°F | Ht 59.0 in | Wt 144.6 lb

## 2012-04-24 DIAGNOSIS — IMO0002 Reserved for concepts with insufficient information to code with codable children: Secondary | ICD-10-CM

## 2012-04-24 DIAGNOSIS — Z1239 Encounter for other screening for malignant neoplasm of breast: Secondary | ICD-10-CM

## 2012-04-24 DIAGNOSIS — N63 Unspecified lump in unspecified breast: Secondary | ICD-10-CM

## 2012-04-24 NOTE — Patient Instructions (Addendum)
Taught patient how to perform BSE. Patient did not need a Pap smear today due to last Pap smear was 03/22/2012. Discussed needed follow up for abnormal Pap smear and gave information to take home. Referred patient to the [facility] for colposcopy. Appointment scheduled for Wednesday, May 09, 2012 at 1415. Patient had a right breast ultrasound 06/23/2009 that follow up was recommended in 6 months. Patient did not follow up. Referred patient to the [facility] for right breast ultrasound per recommendation. Appointment scheduled for Friday, April 27, 2012 at 0945. Patient aware of appointments and will be there. Patient verbalized understanding.

## 2012-04-24 NOTE — Progress Notes (Signed)
Patient referred to B[facility]CP by the [facility] Department due to an abnormal Pap smear recommending a colposcopy for follow up.  Pap Smear:    Pap smear not completed today. Last Pap smear was 03/22/2012 at the [facility] Department and LSIL. Per patient this is her first abnormal Pap smear. Referred patient to the [facility] for colposcopy. Appointment scheduled for Wednesday, May 09, 2012 at 1415. Pap smear result is scanned in EPIC under media.  Physical exam: Breasts Breasts symmetrical. No skin abnormalities bilateral breasts. No nipple retraction bilateral breasts. No nipple discharge bilateral breasts. No lymphadenopathy. No lumps palpated bilateral breasts. No complaints of pain or tenderness on exam. Patient had a right breast ultrasound 07/20/2009 that a 6 month follow up right breast ultrasound was recommended. Patient did not follow up. Referred patient to the [facility] for right breast ultrasound per recommendation. Appointment scheduled for Friday, April 27, 2012 at 0945.  Pelvic/Bimanual No Pap smear completed today since last Pap smear was 03/22/2012. Pap smear not indicated per BCCCP guidelines.

## 2012-04-27 ENCOUNTER — Other Ambulatory Visit: Payer: Self-pay

## 2012-05-03 ENCOUNTER — Ambulatory Visit
Admission: RE | Admit: 2012-05-03 | Discharge: 2012-05-03 | Disposition: A | Discharge status: Home / Self Care | Payer: No Typology Code available for payment source | Source: Ambulatory Visit | Attending: Obstetrics and Gynecology | Admitting: Obstetrics and Gynecology

## 2012-05-03 DIAGNOSIS — N63 Unspecified lump in unspecified breast: Secondary | ICD-10-CM

## 2012-05-09 ENCOUNTER — Other Ambulatory Visit: Payer: Self-pay | Admitting: Obstetrics & Gynecology

## 2012-05-09 ENCOUNTER — Other Ambulatory Visit (HOSPITAL_COMMUNITY)
Admission: RE | Admit: 2012-05-09 | Discharge: 2012-05-09 | Disposition: A | Discharge status: Home / Self Care | Payer: Self-pay | Source: Ambulatory Visit | Attending: Obstetrics & Gynecology | Admitting: Obstetrics & Gynecology

## 2012-05-09 ENCOUNTER — Encounter: Payer: Self-pay | Admitting: Obstetrics & Gynecology

## 2012-05-09 ENCOUNTER — Ambulatory Visit (INDEPENDENT_AMBULATORY_CARE_PROVIDER_SITE_OTHER): Payer: Self-pay | Admitting: Obstetrics & Gynecology

## 2012-05-09 VITALS — BP 119/80 | HR 61 | Temp 98.0°F | Ht 59.0 in | Wt 146.4 lb

## 2012-05-09 DIAGNOSIS — R87612 Low grade squamous intraepithelial lesion on cytologic smear of cervix (LGSIL): Secondary | ICD-10-CM | POA: Insufficient documentation

## 2012-05-09 NOTE — Progress Notes (Signed)
  Subjective:    Patient ID: Janet French, female    DOB: 05-16-1985, 27 y.o.   MRN: [id number]  HPI  27 yo S AA lady with a LGSIL pap 1/14. She is here for a colposcopy. She has been monogamous for 3 years and is not a smoker  Review of Systems     Objective:   Physical Exam  UPT negative, consent signed, time out done Cervix prepped with acetic acid. Transformation zone seen in its entirety. Colpo adequate. Changes c/w LGSIL seen at the 12 o'clock postion (acetowhite changes) ECC obtained. She tolerated the procedure well.       Assessment & Plan:  LGSIL pap- s/p colpo c/w LGSIL Rec pap in 1 year if ECC negative Start Gardasil series today. RTC 2 months for Gardasil #2.

## 2012-05-16 ENCOUNTER — Encounter: Payer: Self-pay | Admitting: *Deleted

## 2012-06-11 ENCOUNTER — Encounter (HOSPITAL_COMMUNITY): Payer: Self-pay | Admitting: *Deleted

## 2012-06-11 ENCOUNTER — Encounter: Payer: Self-pay | Admitting: *Deleted

## 2012-06-11 ENCOUNTER — Telehealth (HOSPITAL_COMMUNITY): Payer: Self-pay | Admitting: *Deleted

## 2012-06-11 NOTE — Telephone Encounter (Signed)
Called patient to follow up if she had received results to colposcopy. Let her know that it was negative and that she needs a Pap smear in 1 year. Let her know that she can come back to [facility] or can come to one of the free cervical cancer screenings to have Pap smear completed if does not have insurance. Patient aware of result. Patient verbalized understanding.

## 2013-07-05 IMAGING — CR DG CHEST 2V
2 series · 2 of 2 positions shown · non-contrast
Comparison: None.

CLINICAL DATA: Cough for past week.

CHEST - 2 VIEW

[w chest pa]
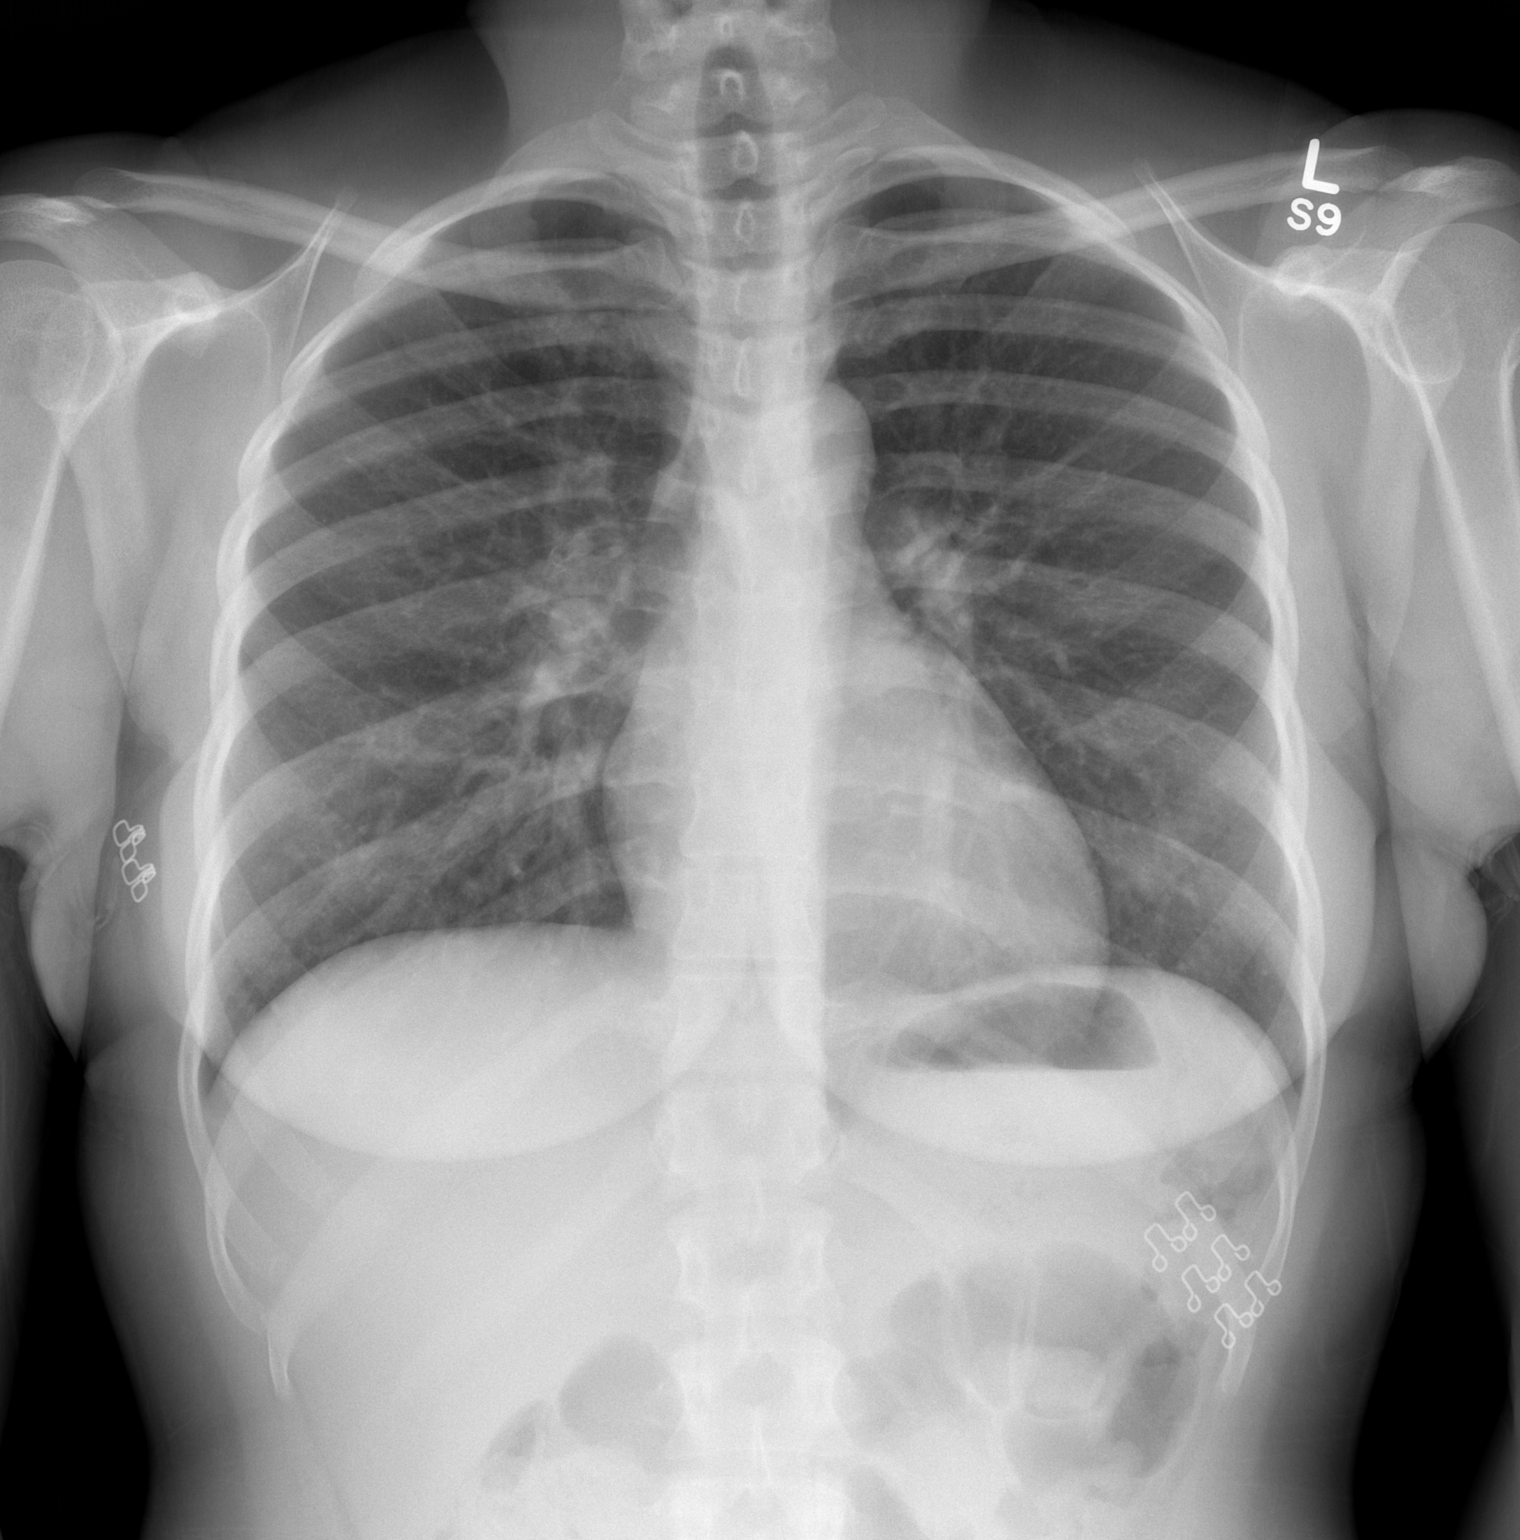

[w chest lat]
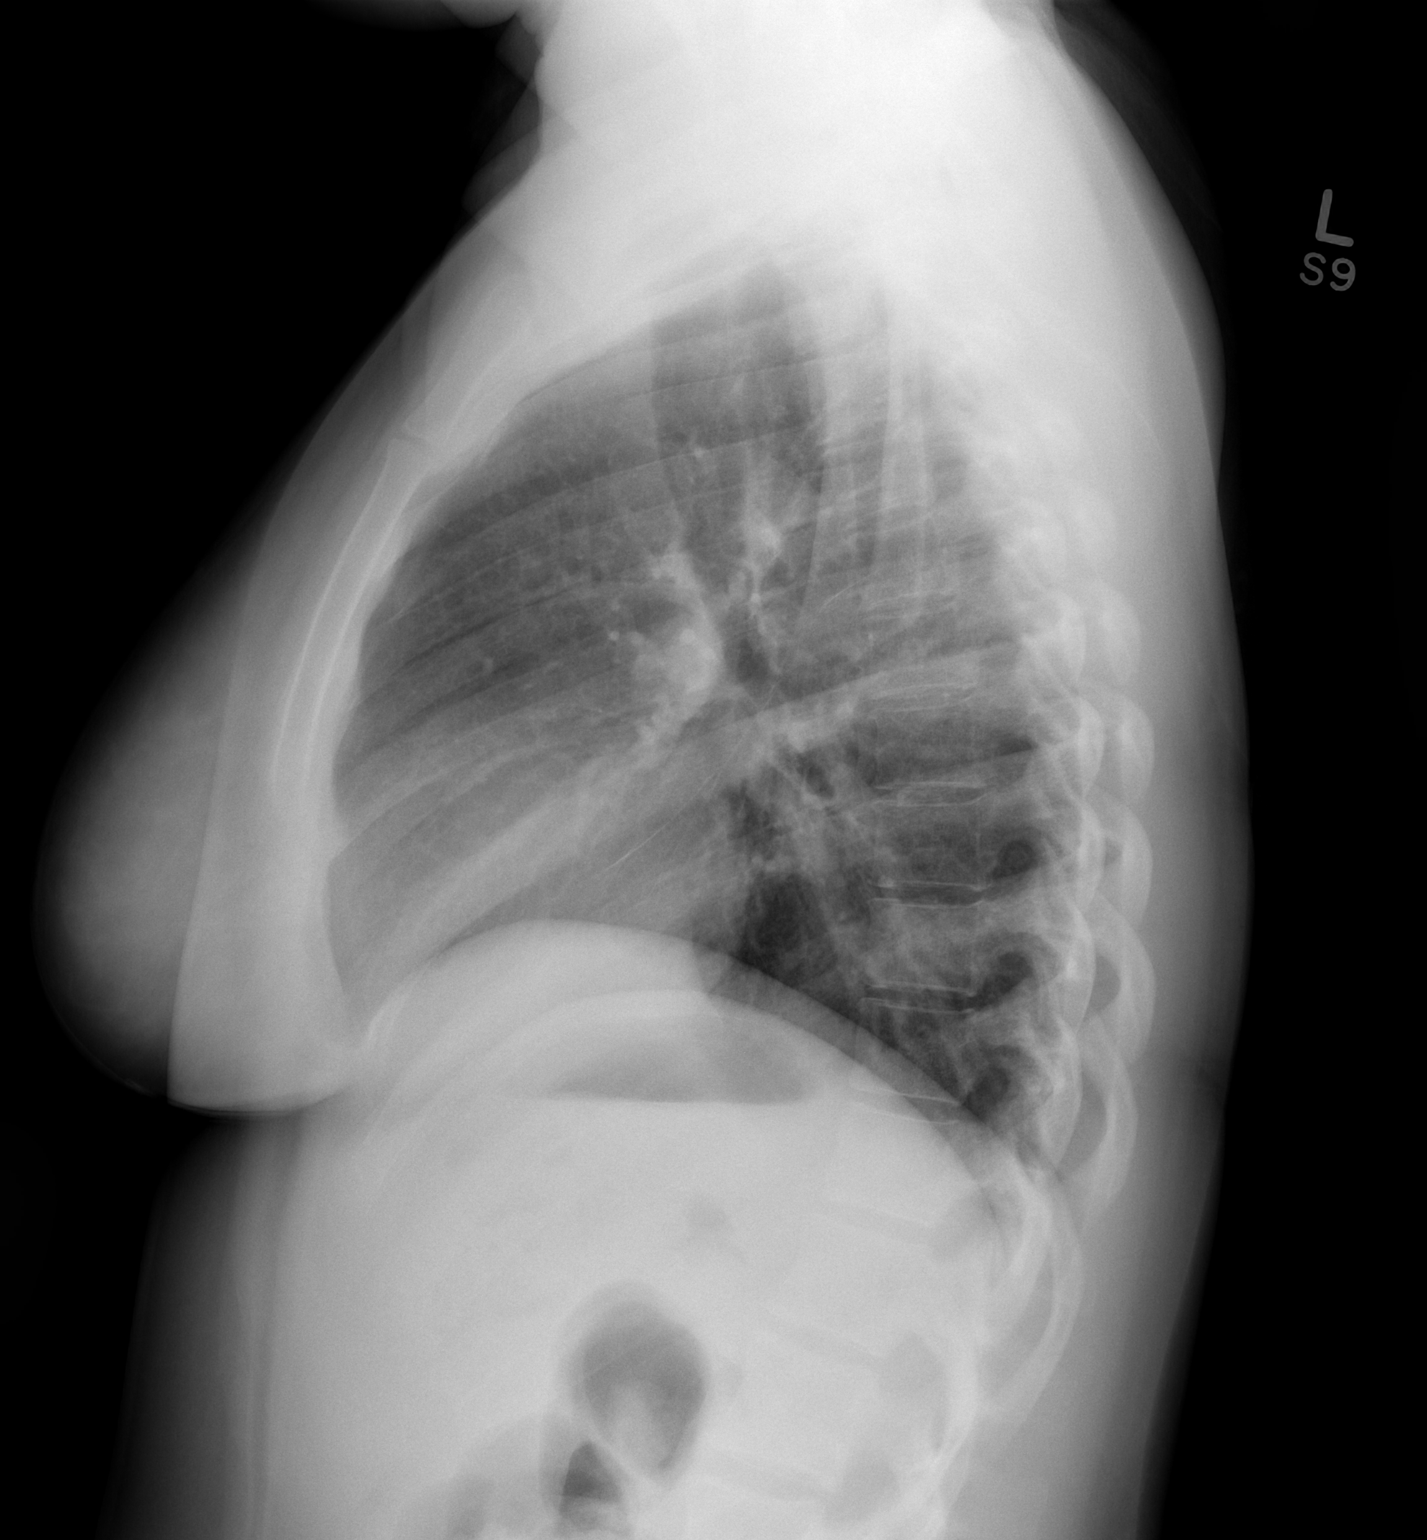

[2 of 2 positions shown; findings below may reference images not displayed]

FINDINGS: Mild central pulmonary vascular prominence with minimal
peribronchial thickening without segmental infiltrate.

No pneumothorax.

Heart size within normal limits.
IMPRESSION: Mild central pulmonary vascular prominence with minimal
peribronchial thickening without segmental infiltrate.

## 2013-07-23 ENCOUNTER — Ambulatory Visit (HOSPITAL_COMMUNITY)
Admission: RE | Admit: 2013-07-23 | Discharge: 2013-07-23 | Disposition: A | Discharge status: Home / Self Care | Payer: Self-pay | Source: Ambulatory Visit | Attending: Obstetrics and Gynecology | Admitting: Obstetrics and Gynecology

## 2013-07-23 ENCOUNTER — Encounter (HOSPITAL_COMMUNITY): Payer: Self-pay

## 2013-07-23 VITALS — BP 106/70 | Temp 98.5°F | Ht 59.0 in | Wt 155.2 lb

## 2013-07-23 DIAGNOSIS — Z1239 Encounter for other screening for malignant neoplasm of breast: Secondary | ICD-10-CM

## 2013-07-23 DIAGNOSIS — R87612 Low grade squamous intraepithelial lesion on cytologic smear of cervix (LGSIL): Secondary | ICD-10-CM | POA: Insufficient documentation

## 2013-07-23 NOTE — Progress Notes (Signed)
Patient referred to B[facility]CP by the [facility] Department for a follow-up colposcopy for abnormal Pap smear.   Pap Smear:   Pap smear not completed today. Last Pap smear was 06/07/2013 at the [facility] Department and LGSIL. Referred patient to the [facility] for a colposcopy to follow-up for abnormal Pap smear. Appointment scheduled for Friday, August 09, 2013 at 0800. Patient has a history of an abnormal Pap smear 03/22/2012 that was LSIL. Patient has a history of a colposcopy 05/09/2012 that was negative. Last Pap smear result is scanned in EPIC under media. Previous colposcopy results are in EPIC.  Physical exam: Breasts Breasts symmetrical. No skin abnormalities bilateral breasts. No nipple retraction bilateral breasts. No nipple discharge bilateral breasts. No lymphadenopathy. No lumps palpated bilateral breasts. No complaints of pain or tenderness on exam. Screening mammogram recommended at age 28 unless clinically indicated prior.   Pelvic/Bimanual No Pap smear completed today since last Pap smear was 06/07/2013. Pap smear not indicated per BCCCP guidelines.

## 2013-07-23 NOTE — Patient Instructions (Signed)
Taught Janet French how to perform BSE. Patient did not need a Pap smear today due to last Pap smear was 06/07/2013.  Referred patient to the [facility] for a colposcopy to follow-up for abnormal Pap smear. Appointment scheduled for Friday, August 09, 2013 at 0800. Patient aware of appointment and will be there. Told patient she will need a screening mammogram at age 28 unless clinically indicated prior. Janet French verbalized understanding.  Saintclair Halsted, RN 1:07 PM

## 2013-07-24 ENCOUNTER — Encounter (HOSPITAL_COMMUNITY): Payer: Self-pay

## 2013-08-09 ENCOUNTER — Encounter: Payer: Self-pay | Admitting: Family Medicine

## 2013-08-09 ENCOUNTER — Other Ambulatory Visit (HOSPITAL_COMMUNITY)
Admission: RE | Admit: 2013-08-09 | Discharge: 2013-08-09 | Disposition: A | Discharge status: Home / Self Care | Payer: Self-pay | Source: Ambulatory Visit | Attending: Family Medicine | Admitting: Family Medicine

## 2013-08-09 ENCOUNTER — Ambulatory Visit (INDEPENDENT_AMBULATORY_CARE_PROVIDER_SITE_OTHER): Payer: Self-pay | Admitting: Family Medicine

## 2013-08-09 VITALS — BP 107/71 | HR 76 | Temp 97.9°F | Ht 59.0 in | Wt 154.1 lb

## 2013-08-09 DIAGNOSIS — R87612 Low grade squamous intraepithelial lesion on cytologic smear of cervix (LGSIL): Secondary | ICD-10-CM

## 2013-08-09 LAB — POCT PREGNANCY, URINE: PREG TEST UR: NEGATIVE

## 2013-08-09 NOTE — Addendum Note (Signed)
Addended by: Vale HavenBECK, KELI L on: 08/09/2013 11:03 AM   Modules accepted: Orders

## 2013-08-09 NOTE — Progress Notes (Signed)
COLPOSCOPY PROCEDURE NOTE  Pt here for colposocopy for LSIL on PAP 06/07/13 with high grade HPV.  Hx of LSIL and normal colposcopy 04/2012 with normal ECC.  No tobacco use. No family hx.   Patient given informed consent, signed copy in the chart, time out was performed.  Placed in lithotomy position. Cervix viewed with speculum and colposcope after application of acetic acid.   Colposcopy adequate?  yes Acetowhite lesions?yes- thin, irregular whispy borders at 11, 12, and 3 oclock.  Punctation?no Mosaicism?  no Abnormal vasculature?  no Biopsies?yes at 11 o'clock ECC?no   COMMENTS: Patient was given post procedure instructions.    BECK, Redmond BasemanKELI L, MD

## 2013-08-09 NOTE — Patient Instructions (Signed)

## 2013-10-15 ENCOUNTER — Encounter: Payer: Self-pay | Admitting: General Practice

## 2013-12-23 ENCOUNTER — Encounter: Payer: Self-pay | Admitting: Family Medicine

## 2014-02-04 ENCOUNTER — Encounter: Payer: Self-pay | Admitting: *Deleted

## 2014-05-02 ENCOUNTER — Emergency Department (HOSPITAL_COMMUNITY)
Admission: EM | Admit: 2014-05-02 | Discharge: 2014-05-02 | Disposition: A | Discharge status: Home / Self Care | Payer: 59 | Source: Home / Self Care | Attending: Emergency Medicine | Admitting: Emergency Medicine

## 2014-05-02 ENCOUNTER — Encounter (HOSPITAL_COMMUNITY): Payer: Self-pay | Admitting: Emergency Medicine

## 2014-05-02 DIAGNOSIS — J020 Streptococcal pharyngitis: Secondary | ICD-10-CM

## 2014-05-02 LAB — POCT RAPID STREP A: Streptococcus, Group A Screen (Direct): POSITIVE — AB

## 2014-05-02 MED ORDER — METHYLPREDNISOLONE ACETATE 80 MG/ML IJ SUSP
INTRAMUSCULAR | Status: AC
  Filled 2014-05-02: qty 1

## 2014-05-02 MED ORDER — METHYLPREDNISOLONE ACETATE PF 80 MG/ML IJ SUSP
80.0000 mg | Freq: Once | INTRAMUSCULAR | Status: AC
  Administered 2014-05-02: 80 mg via INTRAMUSCULAR

## 2014-05-02 MED ORDER — AMOXICILLIN 500 MG PO CAPS: 500.0000 mg | ORAL_CAPSULE | Freq: Two times a day (BID) | ORAL | Status: DC

## 2014-05-02 NOTE — Discharge Instructions (Signed)
You have strep throat. Take amoxicillin twice a day for 10 days. You can use over-the-counter Chloraseptic spray, salt water gargles, tea with honey to help with the sore throat. You should start to feel better within 2 days of starting the antibiotic. Please make sure you complete the full 10 days. Follow-up as needed.

## 2014-05-02 NOTE — ED Notes (Signed)
C/o ST onset yest; hurts to swallow Also reports vomiting episode this am Denies fevers, chills, cold sx Taking Robitussin w/no relief Alert, no signs of acute distress.

## 2014-05-02 NOTE — ED Provider Notes (Signed)
CSN: [id number]     Arrival date & time 05/02/14  [id number] History   First MD Initiated Contact with Patient 05/02/14 1046     Chief Complaint  Patient presents with  . Sore Throat   (Consider location/radiation/quality/duration/timing/severity/associated sxs/prior Treatment) HPI  She is a 29 year old woman here for evaluation of sore throat. This started yesterday and has gradually been getting worse.  It is particularly painful to swallow. She denies any nasal congestion or rhinorrhea. No cough or shortness of breath. She denies fevers. Does report body aches and chills. She had one episode of vomiting this morning after taking Robitussin.  Her boyfriend was diagnosed with strep 2 days ago.  History reviewed. No pertinent past medical history. Past Surgical History  Procedure Laterality Date  . Wisdom tooth extraction    . Wisdom teeth removal  2003   Family History  Problem Relation Age of Onset  . Hypertension Father   . Cancer Maternal Grandmother    History  Substance Use Topics  . Smoking status: Never Smoker   . Smokeless tobacco: Never Used  . Alcohol Use: No     Comment: occasionally   OB History    Gravida Para Term Preterm AB TAB SAB Ectopic Multiple Living   3 2 2  1 1    2      Review of Systems  Constitutional: Positive for chills and fatigue. Negative for fever.  HENT: Positive for sore throat. Negative for congestion, rhinorrhea and trouble swallowing.   Respiratory: Negative for cough and shortness of breath.   Gastrointestinal: Positive for vomiting. Negative for nausea.  Musculoskeletal: Positive for myalgias.  Neurological: Negative for headaches.    Allergies  Review of patient's allergies indicates no known allergies.  Home Medications   Prior to Admission medications   Medication Sig Start Date End Date Taking? Authorizing Provider  amoxicillin (AMOXIL) 500 MG capsule Take 1 capsule (500 mg total) by mouth 2 (two) times daily. 05/02/14   Charm RingsErin J  Honig, MD  medroxyPROGESTERone (DEPO-PROVERA) 150 MG/ML injection Inject 150 mg into the muscle every 3 (three) months. Last shot March 22, 2012    Historical Provider, MD   BP 130/81 mmHg  Pulse 95  Temp(Src) 98.8 F (37.1 C) (Oral)  Resp 18  SpO2 100%  LMP 04/09/2013 Physical Exam  Constitutional: She is oriented to person, place, and time. She appears well-developed and well-nourished. No distress.  HENT:  Head: Normocephalic and atraumatic.  Nose: Nose normal.  Mouth/Throat: Posterior oropharyngeal erythema present. No oropharyngeal exudate.  Neck: Neck supple.  Cardiovascular: Normal rate, regular rhythm and normal heart sounds.   No murmur heard. Pulmonary/Chest: Effort normal and breath sounds normal. No respiratory distress. She has no wheezes. She has no rales.  Lymphadenopathy:    She has no cervical adenopathy.  Neurological: She is alert and oriented to person, place, and time.    ED Course  Procedures (including critical care time) Labs Review Labs Reviewed  POCT RAPID STREP A ([facility]ARE ONLY) - Abnormal; Notable for the following:    Streptococcus, Group A Screen (Direct) POSITIVE (*)    All other components within normal limits    Imaging Review No results found.   MDM   1. Strep throat    Depo-Medrol 80 mg IM given.  We'll treat with amoxicillin for 10 days. Discussed symptomatic care for sore throat. Follow-up as needed.  Charm RingsErin J Honig, MD 05/02/14 1058

## 2014-06-24 ENCOUNTER — Encounter (HOSPITAL_COMMUNITY): Payer: Self-pay | Admitting: *Deleted

## 2014-07-12 NOTE — H&P (Signed)
GYN H&P  29yo G2P2 who presents for laparoscopic tubal ligation as she desires permanent sterilization. She has two children and does not want any more children. She was previously on Depot and did not like the side effects of weight gain. Prior to that she had an IUD placed postpartum and fell out within 2-3 weeks of placement. She has discussed this with her partner, who does not want a vasectomy, and they have decided to go with a tubal ligation.    Past Medical History  No Medical History.   Surgical History  Denies Past Surgical History   Current Medications  Taking   Phentermine HCl 37.5 MG Tablet 1 tablet Once a day    Family History  Father: alive 4346 yrs, diagnosed with DM, HTN  Mother: alive 5649 yrs  4 brother(s) , 3 sister(s) .    Social History  General:  Tobacco use  cigarettes: Never smoked Tobacco history last updated 05/26/2014 no Smoking.  Alcohol: yes, Rare.  Caffeine: rare.  no Recreational drug use, none.  Diet: no particular dietary program.  Exercise: personal trainer, 3x weekly.  Occupation: [employer].  Education: 2nd yr of college.  Marital Status: Single.  Children: 2.    Gyn History  Sexual activity currently sexually active.  LMP Irregular with Depo-stopped in Feb.  Last pap smear date 05/2013.  Denies H/O Last mammogram date.  Abnormal pap smear assessed with colposcopy.    OB History  Pregnancy # 1 live birth, vaginal delivery.  Pregnancy # 2 live birth, vaginal delivery.    Allergies  N.K.D.A.   Hospitalization/Major Diagnostic Procedure  Denies Past Hospitalization   Review of Systems  CONSTITUTIONAL:  no Chills. no Fever. no Skin rash.  HEENT:  Blurrred vision no.  CARDIOLOGY:  no Chest pain.  RESPIRATORY:  no Shortness of breath. no Cough.  GASTROENTEROLOGY:  no Abdominal pain. no Appetite change. no Change in bowel movements.  UROLOGY:  no Urinary frequency. no Urinary incontinence. no Urinary urgency.  FEMALE  REPRODUCTIVE:  no Abnormal vaginal discharge. no Breast lumps or discharge. no Breast pain. no Dyspareunia. no Vaginal irritation. no Vaginal itching. no Vaginal spotting.  NEUROLOGY:  no Dizziness. no Headache.     O: Examination performed in office: GENERAL APPEARANCE alert, oriented, NAD, pleasant.  SKIN: normal, no rash.  LUNGS: clear to auscultation bilaterally, no wheezes, rhonchi, rales.  HEART: no murmurs, regular rate and rhythm.  ABDOMEN: no masses palpated, soft and not tender, no rebound, no guarding.  FEMALE GENITOURINARY: No external lesions, Vagina - pink moist mucosa, no lesions or abnormal discharge, cervix - closed, no discharge or lesions. No CMT. No adnexal masses bilaterally. Uterus: nontender and normal size on palpation, tilted to patient's right.  EXTREMITIES: no edema present, no calf tenderness bilaterally.   A/P: 29yo G2P2 who presents for laparoscopic tubal ligation for permanent sterilization. -NPO -LR @ 125cc/hr -SCDs to OR  Bilateral tubal ligation reviewed with R&B including but not limited to bleeding, infection, injury to other organs, irreversibility and failure rate of 2-10 per 1000. Questions and concerns were addressed and pt wishes to proceed with procedure.  Myna HidalgoJennifer Ezrah Panning, DO [phone] (pager) [phone] (office)

## 2014-07-16 ENCOUNTER — Encounter (HOSPITAL_COMMUNITY)
Admission: RE | Disposition: A | Discharge status: Home / Self Care | Payer: Self-pay | Source: Ambulatory Visit | Attending: Obstetrics & Gynecology

## 2014-07-16 ENCOUNTER — Ambulatory Visit (HOSPITAL_COMMUNITY): Payer: 59 | Admitting: Anesthesiology

## 2014-07-16 ENCOUNTER — Ambulatory Visit (HOSPITAL_COMMUNITY)
Admission: RE | Admit: 2014-07-16 | Discharge: 2014-07-16 | Disposition: A | Discharge status: Home / Self Care | Payer: 59 | Source: Ambulatory Visit | Attending: Obstetrics & Gynecology | Admitting: Obstetrics & Gynecology

## 2014-07-16 ENCOUNTER — Encounter (HOSPITAL_COMMUNITY): Payer: Self-pay | Admitting: Anesthesiology

## 2014-07-16 DIAGNOSIS — Z302 Encounter for sterilization: Secondary | ICD-10-CM | POA: Insufficient documentation

## 2014-07-16 HISTORY — PX: LAPAROSCOPIC TUBAL LIGATION: SHX1937

## 2014-07-16 LAB — CBC
HEMATOCRIT: 36 % (ref 36.0–46.0)
HEMOGLOBIN: 11.9 g/dL — AB (ref 12.0–15.0)
MCH: 28.8 pg (ref 26.0–34.0)
MCHC: 33.1 g/dL (ref 30.0–36.0)
MCV: 87.2 fL (ref 78.0–100.0)
PLATELETS: 277 10*3/uL (ref 150–400)
RBC: 4.13 MIL/uL (ref 3.87–5.11)
RDW: 13.1 % (ref 11.5–15.5)
WBC: 6.8 10*3/uL (ref 4.0–10.5)

## 2014-07-16 LAB — TYPE AND SCREEN
ABO/RH(D): A POS
Antibody Screen: NEGATIVE

## 2014-07-16 LAB — PREGNANCY, URINE: Preg Test, Ur: NEGATIVE

## 2014-07-16 LAB — ABO/RH: ABO/RH(D): A POS

## 2014-07-16 SURGERY — LIGATION, FALLOPIAN TUBE, LAPAROSCOPIC
Anesthesia: General | Site: Abdomen | Laterality: Bilateral

## 2014-07-16 MED ORDER — FENTANYL CITRATE (PF) 250 MCG/5ML IJ SOLN
INTRAMUSCULAR | Status: DC | PRN
  Administered 2014-07-16 (×5): 50 ug via INTRAVENOUS

## 2014-07-16 MED ORDER — NEOSTIGMINE METHYLSULFATE 10 MG/10ML IV SOLN
INTRAVENOUS | Status: DC | PRN
  Administered 2014-07-16: 4 mg via INTRAVENOUS

## 2014-07-16 MED ORDER — LIDOCAINE HCL (CARDIAC) 20 MG/ML IV SOLN
INTRAVENOUS | Status: DC | PRN
  Administered 2014-07-16: 60 mg via INTRAVENOUS

## 2014-07-16 MED ORDER — OXYCODONE-ACETAMINOPHEN 5-325 MG PO TABS: 1.0000 | ORAL_TABLET | Freq: Four times a day (QID) | ORAL | Status: DC | PRN

## 2014-07-16 MED ORDER — ROCURONIUM BROMIDE 100 MG/10ML IV SOLN
INTRAVENOUS | Status: DC | PRN
  Administered 2014-07-16: 5 mg via INTRAVENOUS

## 2014-07-16 MED ORDER — IBUPROFEN 600 MG PO TABS: 600.0000 mg | ORAL_TABLET | Freq: Four times a day (QID) | ORAL | Status: DC | PRN

## 2014-07-16 MED ORDER — KETOROLAC TROMETHAMINE 30 MG/ML IJ SOLN
INTRAMUSCULAR | Status: DC | PRN
  Administered 2014-07-16: 30 mg via INTRAVENOUS

## 2014-07-16 MED ORDER — 0.9 % SODIUM CHLORIDE (POUR BTL) OPTIME
TOPICAL | Status: DC | PRN
  Administered 2014-07-16: 1000 mL

## 2014-07-16 MED ORDER — METOCLOPRAMIDE HCL 5 MG/ML IJ SOLN: 10.0000 mg | Freq: Once | INTRAMUSCULAR | Status: DC | PRN

## 2014-07-16 MED ORDER — MIDAZOLAM HCL 2 MG/2ML IJ SOLN
INTRAMUSCULAR | Status: DC | PRN
  Administered 2014-07-16: 2 mg via INTRAVENOUS

## 2014-07-16 MED ORDER — DEXAMETHASONE SODIUM PHOSPHATE 10 MG/ML IJ SOLN
INTRAMUSCULAR | Status: AC
  Filled 2014-07-16: qty 1

## 2014-07-16 MED ORDER — DOCUSATE SODIUM 100 MG PO CAPS: 100.0000 mg | ORAL_CAPSULE | Freq: Two times a day (BID) | ORAL | Status: DC

## 2014-07-16 MED ORDER — GLYCOPYRROLATE 0.2 MG/ML IJ SOLN
INTRAMUSCULAR | Status: AC
  Filled 2014-07-16: qty 1

## 2014-07-16 MED ORDER — LACTATED RINGERS IV SOLN
INTRAVENOUS | Status: DC
  Administered 2014-07-16: 125 mL/h via INTRAVENOUS
  Administered 2014-07-16: 08:00:00 via INTRAVENOUS

## 2014-07-16 MED ORDER — BUPIVACAINE HCL (PF) 0.25 % IJ SOLN
INTRAMUSCULAR | Status: DC | PRN
  Administered 2014-07-16: 30 mL

## 2014-07-16 MED ORDER — ONDANSETRON HCL 4 MG/2ML IJ SOLN
INTRAMUSCULAR | Status: AC
  Filled 2014-07-16: qty 2

## 2014-07-16 MED ORDER — SCOPOLAMINE 1 MG/3DAYS TD PT72
MEDICATED_PATCH | TRANSDERMAL | Status: AC
  Filled 2014-07-16: qty 1

## 2014-07-16 MED ORDER — LACTATED RINGERS IV SOLN: INTRAVENOUS | Status: DC

## 2014-07-16 MED ORDER — BUPIVACAINE HCL (PF) 0.25 % IJ SOLN
INTRAMUSCULAR | Status: AC
  Filled 2014-07-16: qty 30

## 2014-07-16 MED ORDER — ROCURONIUM BROMIDE 100 MG/10ML IV SOLN
INTRAVENOUS | Status: AC
  Filled 2014-07-16: qty 1

## 2014-07-16 MED ORDER — GLYCOPYRROLATE 0.2 MG/ML IJ SOLN
INTRAMUSCULAR | Status: DC | PRN
  Administered 2014-07-16: .8 mg via INTRAVENOUS
  Administered 2014-07-16: 0.1 mg via INTRAVENOUS

## 2014-07-16 MED ORDER — FENTANYL CITRATE (PF) 250 MCG/5ML IJ SOLN
INTRAMUSCULAR | Status: AC
  Filled 2014-07-16: qty 5

## 2014-07-16 MED ORDER — NEOSTIGMINE METHYLSULFATE 10 MG/10ML IV SOLN
INTRAVENOUS | Status: AC
  Filled 2014-07-16: qty 1

## 2014-07-16 MED ORDER — PROPOFOL 10 MG/ML IV BOLUS
INTRAVENOUS | Status: DC | PRN
  Administered 2014-07-16: 170 mg via INTRAVENOUS
  Administered 2014-07-16: 25 mg via INTRAVENOUS

## 2014-07-16 MED ORDER — PROPOFOL 10 MG/ML IV BOLUS
INTRAVENOUS | Status: AC
  Filled 2014-07-16: qty 20

## 2014-07-16 MED ORDER — ONDANSETRON HCL 4 MG/2ML IJ SOLN
INTRAMUSCULAR | Status: DC | PRN
  Administered 2014-07-16: 4 mg via INTRAVENOUS

## 2014-07-16 MED ORDER — LIDOCAINE HCL (CARDIAC) 20 MG/ML IV SOLN
INTRAVENOUS | Status: AC
  Filled 2014-07-16: qty 5

## 2014-07-16 MED ORDER — MIDAZOLAM HCL 2 MG/2ML IJ SOLN
INTRAMUSCULAR | Status: AC
Start: 2014-07-16 — End: 2014-07-16
  Filled 2014-07-16: qty 2

## 2014-07-16 MED ORDER — DEXAMETHASONE SODIUM PHOSPHATE 4 MG/ML IJ SOLN
INTRAMUSCULAR | Status: AC
  Filled 2014-07-16: qty 1

## 2014-07-16 MED ORDER — SCOPOLAMINE 1 MG/3DAYS TD PT72
1.0000 | MEDICATED_PATCH | Freq: Once | TRANSDERMAL | Status: DC
  Administered 2014-07-16: 1.5 mg via TRANSDERMAL

## 2014-07-16 MED ORDER — HYDROMORPHONE HCL 1 MG/ML IJ SOLN: 0.2500 mg | INTRAMUSCULAR | Status: DC | PRN

## 2014-07-16 MED ORDER — DEXAMETHASONE SODIUM PHOSPHATE 4 MG/ML IJ SOLN
INTRAMUSCULAR | Status: DC | PRN
  Administered 2014-07-16: 5 mg via INTRAVENOUS

## 2014-07-16 MED ORDER — GLYCOPYRROLATE 0.2 MG/ML IJ SOLN
INTRAMUSCULAR | Status: AC
  Filled 2014-07-16: qty 4

## 2014-07-16 SURGICAL SUPPLY — 24 items
CATH ROBINSON RED A/P 16FR (CATHETERS) ×2 IMPLANT
CLOTH BEACON ORANGE TIMEOUT ST (SAFETY) ×2 IMPLANT
DRSG COVADERM PLUS 2X2 (GAUZE/BANDAGES/DRESSINGS) ×4 IMPLANT
DRSG OPSITE POSTOP 3X4 (GAUZE/BANDAGES/DRESSINGS) ×1 IMPLANT
DURAPREP 26ML APPLICATOR (WOUND CARE) ×2 IMPLANT
GLOVE BIOGEL PI IND STRL 6.5 (GLOVE) ×2 IMPLANT
GLOVE BIOGEL PI INDICATOR 6.5 (GLOVE) ×2
GLOVE ECLIPSE 6.5 STRL STRAW (GLOVE) ×2 IMPLANT
GOWN STRL REUS W/TWL LRG LVL3 (GOWN DISPOSABLE) ×4 IMPLANT
LIQUID BAND (GAUZE/BANDAGES/DRESSINGS) ×2 IMPLANT
NEEDLE INSUFFLATION 120MM (ENDOMECHANICALS) ×1 IMPLANT
PACK LAPAROSCOPY BASIN (CUSTOM PROCEDURE TRAY) ×2 IMPLANT
PAD OB MATERNITY 4.3X12.25 (PERSONAL CARE ITEMS) ×2 IMPLANT
PAD POSITIONER PINK NONSTERILE (MISCELLANEOUS) ×2 IMPLANT
PROTECTOR NERVE ULNAR (MISCELLANEOUS) ×2 IMPLANT
SOLUTION ELECTROLUBE (MISCELLANEOUS) ×1 IMPLANT
STRIP CLOSURE SKIN 1/4X4 (GAUZE/BANDAGES/DRESSINGS) IMPLANT
SUT MON AB 4-0 PS1 27 (SUTURE) ×2 IMPLANT
SUT VICRYL 0 UR6 27IN ABS (SUTURE) ×1 IMPLANT
TOWEL OR 17X24 6PK STRL BLUE (TOWEL DISPOSABLE) ×4 IMPLANT
TROCAR XCEL NON-BLD 11X100MML (ENDOMECHANICALS) ×2 IMPLANT
TROCAR XCEL NON-BLD 5MMX100MML (ENDOMECHANICALS) ×1 IMPLANT
WARMER LAPAROSCOPE (MISCELLANEOUS) ×2 IMPLANT
WATER STERILE IRR 1000ML POUR (IV SOLUTION) ×2 IMPLANT

## 2014-07-16 NOTE — Anesthesia Postprocedure Evaluation (Signed)
  Anesthesia Post-op Note  Patient: Janet French  Procedure(s) Performed: Procedure(s): LAPAROSCOPIC TUBAL LIGATION (Bilateral)  Patient Location: PACU  Anesthesia Type:General  Level of Consciousness: awake, alert  and oriented  Airway and Oxygen Therapy: Patient Spontanous Breathing  Post-op Pain: mild  Post-op Assessment: Post-op Vital signs reviewed, Patient's Cardiovascular Status Stable, Respiratory Function Stable, Patent Airway, No signs of Nausea or vomiting and Pain level controlled  Post-op Vital Signs: Reviewed and stable  Last Vitals:  Filed Vitals:   07/16/14 0900  BP: 98/58  Pulse:   Temp:   Resp:     Complications: No apparent anesthesia complications

## 2014-07-16 NOTE — Anesthesia Preprocedure Evaluation (Signed)
Anesthesia Evaluation  Patient identified by MRN, date of birth, ID band Patient awake    Reviewed: Allergy & Precautions, NPO status , Patient's Chart, lab work & pertinent test results  Airway Mallampati: II  TM Distance: >3 FB Neck ROM: Full    Dental no notable dental hx. (+) Teeth Intact   Pulmonary neg pulmonary ROS,  breath sounds clear to auscultation  Pulmonary exam normal       Cardiovascular negative cardio ROS Normal cardiovascular examRhythm:Regular Rate:Normal     Neuro/Psych negative neurological ROS  negative psych ROS   GI/Hepatic negative GI ROS, Neg liver ROS,   Endo/Other  negative endocrine ROS  Renal/GU negative Renal ROS  negative genitourinary   Musculoskeletal negative musculoskeletal ROS (+)   Abdominal   Peds  Hematology negative hematology ROS (+)   Anesthesia Other Findings   Reproductive/Obstetrics Desires permanent contraception                             Anesthesia Physical Anesthesia Plan  ASA: II  Anesthesia Plan: General   Post-op Pain Management:    Induction: Intravenous  Airway Management Planned: Oral ETT  Additional Equipment:   Intra-op Plan:   Post-operative Plan: Extubation in OR  Informed Consent: I have reviewed the patients History and Physical, chart, labs and discussed the procedure including the risks, benefits and alternatives for the proposed anesthesia with the patient or authorized representative who has indicated his/her understanding and acceptance.   Dental advisory given  Plan Discussed with: CRNA, Anesthesiologist and Surgeon  Anesthesia Plan Comments:         Anesthesia Quick Evaluation

## 2014-07-16 NOTE — Interval H&P Note (Signed)
History and Physical Interval Note:  07/16/2014 6:47 AM  Janet French  has presented today for surgery, with the diagnosis of Z30.9 Contraceptive Management  The various methods of treatment have been discussed with the patient and family. After consideration of risks, benefits and other options for treatment, the patient has consented to  Procedure(s): LAPAROSCOPIC TUBAL LIGATION (Bilateral) as a surgical intervention .  The patient's history has been reviewed, patient examined, no change in status, stable for surgery.  I have reviewed the patient's chart and labs.  Questions were answered to the patient's satisfaction.     Myna HidalgoZAN, Waverly Tarquinio, M

## 2014-07-16 NOTE — Op Note (Signed)
OP NOTE  PreOp Diagnosis: 1) Desires permanent sterilization PostOp Diagnosis: same Procedure: Repeat LTCS Surgeon: Dr. Myna HidalgoJennifer Cyndee Giammarco Anesthesia: general Complications: none EBL: 5cc UOP: 30cc  Procedure: The patient was taken to the operating room where she was placed under general anesthesia. She is placed in the dorsolithotomy position. She was prepped and draped in the usual sterile fashion. Speculum was placed into the vaginal vault. In the anterior lip of the cervix grasped with a single-tooth tenaculum. A uterine manipulator was placed without difficulty. The single-tooth tenaculum was removed. The speculum was removed.  Attention was turned to the umbilicus which was injected with 10 cc of quarter percent Marcaine. A 11 mm incision was made with the scalpel. The Veress needle was introduced and saline drop test performed.  Opening pressure was 5mmHg.  The pneumoperitoneum was achieved with CO2 gas.  A 11 mm trocar was placed under direct visualization. Entry into the peritoneum was visualized. The abdomen was inspected- normal appearing bowel and liver.  Appendix not seen.  Uterus, bilateral fallopian tubes and ovaries appeared normal.  Operative scope was inserted and the right fallopian tube was identified and followed out to the fimbriated end. The a 2-3 cm portion of the right fallopian tube was cauterized with the Kleppinger.  This was repeated on the left fallopian tube in a similar fashion. 10 cc of quarter percent Marcaine was placed along the ampullary portion of the tubes bilaterally. Pneumoperitoneum was released. The umbilical trocar was removed under direct visualization. The fascia was closed with 0 Vicryl. The skin was closed with 4-0 Vicryl. Attention was turned vaginally, the Hulka manipulator was removed, hemostasis was achieved with silver nitrate.  Patient was awakened from anesthesia taken to the recovery room awake and in stable condition.  Sponge, lap and needle counts  were correct and the end of the procedure.  Myna HidalgoJennifer Meranda Dechaine, DO [phone] (pager) [phone] (office)

## 2014-07-16 NOTE — Anesthesia Procedure Notes (Signed)
Procedure Name: Intubation Date/Time: 07/16/2014 7:26 AM Performed by: Graciela HusbandsFUSSELL, Hamdi Kley O Pre-anesthesia Checklist: Suction available, Emergency Drugs available, Timeout performed, Patient being monitored and Patient identified Patient Re-evaluated:Patient Re-evaluated prior to inductionOxygen Delivery Method: Circle system utilized Preoxygenation: Pre-oxygenation with 100% oxygen Intubation Type: IV induction Ventilation: Mask ventilation without difficulty Laryngoscope Size: Mac and 3 Grade View: Grade I Tube size: 7.0 mm Number of attempts: 1 Airway Equipment and Method: Stylet Placement Confirmation: ETT inserted through vocal cords under direct vision,  breath sounds checked- equal and bilateral and positive ETCO2 Secured at: 21 cm Tube secured with: Tape Dental Injury: Teeth and Oropharynx as per pre-operative assessment

## 2014-07-16 NOTE — Discharge Instructions (Addendum)
HOME INSTRUCTIONS  Please note any unusual or excessive bleeding, pain, swelling. Mild dizziness or drowsiness are normal for about 24 hours after surgery.   Shower when comfortable  Restrictions: No driving for 24 hours or while taking pain medications.  Activity:  No heavy lifting (> 10 lbs), nothing in vagina (no tampons, douching, or intercourse) x 2 weeks; no tub baths for 2 weeks Vaginal spotting is expected but if your bleeding is heavy, period like,  please call the office   Incision: The honeycomb dressing can be removed after 4-5 days.  You may wash your incision with mild soap and water but do not rub or scrub the incision site.  You may experience slight bloody drainage from your incision periodically.  This is normal.  If you experience a large amount of drainage or the incision opens, please call your physician who will likely direct you to the emergency department.  Diet:  You may eat return to your regular diet.  Do not eat large meals.  Eat small frequent meals throughout the day.  Continue to drink a good amount of water at least 6-8 glasses of water per day, hydration is very important for the healing process.  Pain Management: Take Motrin and/or Norco as prescribed/needed for pain.  Percocet may cause constipation, please be sure to take the Colace (stool softener) twice daily as needed.  Always take prescription pain medication with food, it may cause constipation, increase fluids and fiber and you may want to take an over-the-counter stool softener like Colace as needed up to 2x a day.    Alcohol -- Avoid for 24 hours and while taking pain medications.  Nausea: Take sips of ginger ale or soda  Fever -- Call physician if temperature over 101 degrees  Follow up:  If you do not already have a follow up appointment scheduled, please call the office at [phone].  If you experience fever (a temperature greater than 100.4), pain unrelieved by pain medication, shortness of  breath, swelling of a single leg, or any other symptoms which are concerning to you please the office immediately.   Post Anesthesia Home Care Instructions  Activity: Get plenty of rest for the remainder of the day. A responsible adult should stay with you for 24 hours following the procedure.  For the next 24 hours, DO NOT: -Drive a car -Advertising copywriterperate machinery -Drink alcoholic beverages -Take any medication unless instructed by your physician -Make any legal decisions or sign important papers.  Meals: Start with liquid foods such as gelatin or soup. Progress to regular foods as tolerated. Avoid greasy, spicy, heavy foods. If nausea and/or vomiting occur, drink only clear liquids until the nausea and/or vomiting subsides. Call your physician if vomiting continues.  Special Instructions/Symptoms: Your throat may feel dry or sore from the anesthesia or the breathing tube placed in your throat during surgery. If this causes discomfort, gargle with warm salt water. The discomfort should disappear within 24 hours.  If you had a scopolamine patch placed behind your ear for the management of post- operative nausea and/or vomiting:  1. The medication in the patch is effective for 72 hours, after which it should be removed.  Wrap patch in a tissue and discard in the trash. Wash hands thoroughly with soap and water. 2. You may remove the patch earlier than 72 hours if you experience unpleasant side effects which may include dry mouth, dizziness or visual disturbances. 3. Avoid touching the patch. Wash your hands with soap and  patch. °  ° ° °

## 2014-07-16 NOTE — Transfer of Care (Signed)
Immediate Anesthesia Transfer of Care Note  Patient: Janet French  Procedure(s) Performed: Procedure(s): LAPAROSCOPIC TUBAL LIGATION (Bilateral)  Patient Location: PACU  Anesthesia Type:General  Level of Consciousness: awake, alert  and oriented  Airway & Oxygen Therapy: Patient Spontanous Breathing and Patient connected to nasal cannula oxygen  Post-op Assessment: Report given to RN and Post -op Vital signs reviewed and stable  Post vital signs: Reviewed and stable  Last Vitals:  Filed Vitals:   07/16/14 0650  BP: 110/76  Pulse: 70  Temp: 36.8 C  Resp: 16    Complications: No apparent anesthesia complications

## 2014-07-17 ENCOUNTER — Encounter (HOSPITAL_COMMUNITY): Payer: Self-pay | Admitting: Obstetrics & Gynecology

## 2014-10-17 ENCOUNTER — Other Ambulatory Visit (HOSPITAL_COMMUNITY)
Admission: RE | Admit: 2014-10-17 | Discharge: 2014-10-17 | Disposition: A | Discharge status: Home / Self Care | Payer: 59 | Source: Ambulatory Visit | Attending: Family | Admitting: Family

## 2014-10-17 ENCOUNTER — Other Ambulatory Visit: Payer: Self-pay

## 2014-10-17 DIAGNOSIS — N760 Acute vaginitis: Secondary | ICD-10-CM | POA: Insufficient documentation

## 2014-10-17 DIAGNOSIS — Z113 Encounter for screening for infections with a predominantly sexual mode of transmission: Secondary | ICD-10-CM | POA: Diagnosis present

## 2014-10-17 DIAGNOSIS — Z124 Encounter for screening for malignant neoplasm of cervix: Secondary | ICD-10-CM | POA: Diagnosis present

## 2014-10-17 DIAGNOSIS — N939 Abnormal uterine and vaginal bleeding, unspecified: Secondary | ICD-10-CM | POA: Insufficient documentation

## 2014-10-21 LAB — CYTOLOGY - PAP

## 2015-04-28 ENCOUNTER — Encounter: Payer: Self-pay | Admitting: Family Medicine

## 2015-04-28 ENCOUNTER — Ambulatory Visit (INDEPENDENT_AMBULATORY_CARE_PROVIDER_SITE_OTHER): Payer: 59 | Admitting: Family Medicine

## 2015-04-28 VITALS — BP 112/80 | HR 64 | Temp 98.2°F | Wt 148.8 lb

## 2015-04-28 DIAGNOSIS — K5900 Constipation, unspecified: Secondary | ICD-10-CM

## 2015-04-28 DIAGNOSIS — J029 Acute pharyngitis, unspecified: Secondary | ICD-10-CM | POA: Diagnosis not present

## 2015-04-28 NOTE — Patient Instructions (Signed)
Take Tylenol or ibuprofen as needed for aches and pains. Salt water gargles for sore throat and warm fluids. Sure you are eating a high-fiber diet, staying well hydrated, getting exercise and you may also try MiraLAX one capful daily for constipation.  Pharyngitis Pharyngitis is redness, pain, and swelling (inflammation) of your pharynx.  CAUSES  Pharyngitis is usually caused by infection. Most of the time, these infections are from viruses (viral) and are part of a cold. However, sometimes pharyngitis is caused by bacteria (bacterial). Pharyngitis can also be caused by allergies. Viral pharyngitis may be spread from person to person by coughing, sneezing, and personal items or utensils (cups, forks, spoons, toothbrushes). Bacterial pharyngitis may be spread from person to person by more intimate contact, such as kissing.  SIGNS AND SYMPTOMS  Symptoms of pharyngitis include:   Sore throat.   Tiredness (fatigue).   Low-grade fever.   Headache.  Joint pain and muscle aches.  Skin rashes.  Swollen lymph nodes.  Plaque-like film on throat or tonsils (often seen with bacterial pharyngitis). DIAGNOSIS  Your health care provider will ask you questions about your illness and your symptoms. Your medical history, along with a physical exam, is often all that is needed to diagnose pharyngitis. Sometimes, a rapid strep test is done. Other lab tests may also be done, depending on the suspected cause.  TREATMENT  Viral pharyngitis will usually get better in 3-4 days without the use of medicine. Bacterial pharyngitis is treated with medicines that kill germs (antibiotics).  HOME CARE INSTRUCTIONS   Drink enough water and fluids to keep your urine clear or pale yellow.   Only take over-the-counter or prescription medicines as directed by your health care provider:   If you are prescribed antibiotics, make sure you finish them even if you start to feel better.   Do not take aspirin.    Get lots of rest.   Gargle with 8 oz of salt water ( tsp of salt per 1 qt of water) as often as every 1-2 hours to soothe your throat.   Throat lozenges (if you are not at risk for choking) or sprays may be used to soothe your throat. SEEK MEDICAL CARE IF:   You have large, tender lumps in your neck.  You have a rash.  You cough up green, yellow-Tavares, or bloody spit. SEEK IMMEDIATE MEDICAL CARE IF:   Your neck becomes stiff.  You drool or are unable to swallow liquids.  You vomit or are unable to keep medicines or liquids down.  You have severe pain that does not go away with the use of recommended medicines.  You have trouble breathing (not caused by a stuffy nose). MAKE SURE YOU:   Understand these instructions.  Will watch your condition.  Will get help right away if you are not doing well or get worse.   This information is not intended to replace advice given to you by your health care provider. Make sure you discuss any questions you have with your health care provider.   Document Released: 02/07/2005 Document Revised: 11/28/2012 Document Reviewed: 10/15/2012 Elsevier Interactive Patient Education 2016 [employer]  Constipation, Adult Constipation is when a person has fewer than three bowel movements a week, has difficulty having a bowel movement, or has stools that are dry, hard, or larger than normal. As people grow older, constipation is more common. A low-fiber diet, not taking in enough fluids, and taking certain medicines may make constipation worse.  CAUSES   Certain  medicines, such as antidepressants, pain medicine, iron supplements, antacids, and water pills.   Certain diseases, such as diabetes, irritable bowel syndrome (IBS), thyroid disease, or depression.   Not drinking enough water.   Not eating enough fiber-rich foods.   Stress or travel.   Lack of physical activity or exercise.   Ignoring the urge to have a bowel movement.    Using laxatives too much.  SIGNS AND SYMPTOMS   Having fewer than three bowel movements a week.   Straining to have a bowel movement.   Having stools that are hard, dry, or larger than normal.   Feeling full or bloated.   Pain in the lower abdomen.   Not feeling relief after having a bowel movement.  DIAGNOSIS  Your health care provider will take a medical history and perform a physical exam. Further testing may be done for severe constipation. Some tests may include:  A barium enema X-ray to examine your rectum, colon, and, sometimes, your small intestine.   A sigmoidoscopy to examine your lower colon.   A colonoscopy to examine your entire colon. TREATMENT  Treatment will depend on the severity of your constipation and what is causing it. Some dietary treatments include drinking more fluids and eating more fiber-rich foods. Lifestyle treatments may include regular exercise. If these diet and lifestyle recommendations do not help, your health care provider may recommend taking over-the-counter laxative medicines to help you have bowel movements. Prescription medicines may be prescribed if over-the-counter medicines do not work.  HOME CARE INSTRUCTIONS   Eat foods that have a lot of fiber, such as fruits, vegetables, whole grains, and beans.  Limit foods high in fat and processed sugars, such as french fries, hamburgers, cookies, candies, and soda.   A fiber supplement may be added to your diet if you cannot get enough fiber from foods.   Drink enough fluids to keep your urine clear or pale yellow.   Exercise regularly or as directed by your health care provider.   Go to the restroom when you have the urge to go. Do not hold it.   Only take over-the-counter or prescription medicines as directed by your health care provider. Do not take other medicines for constipation without talking to your health care provider first.  SEEK IMMEDIATE MEDICAL CARE IF:    You have bright red blood in your stool.   Your constipation lasts for more than 4 days or gets worse.   You have abdominal or rectal pain.   You have thin, pencil-like stools.   You have unexplained weight loss. MAKE SURE YOU:   Understand these instructions.  Will watch your condition.  Will get help right away if you are not doing well or get worse.   This information is not intended to replace advice given to you by your health care provider. Make sure you discuss any questions you have with your health care provider.   Document Released: 11/06/2003 Document Revised: 02/28/2014 Document Reviewed: 11/19/2012 Elsevier Interactive Patient Education [employer]

## 2015-04-28 NOTE — Progress Notes (Signed)
Subjective:  Janet French is a 30 y.o. female who presents for evaluation of 1 day history of sore throat.  She has had a recent close exposure to someone with proven streptococcal pharyngitis.  No associated symptoms.  She has history of strep and last had it in January.  History of headaches, no medication needed currently for this.  Reports having occasional constipation and has tried stool softeners without relief.  No other complaints today.  Denies fever, chills, bodyaches, ear pain, nasal congestion, nasal drainage, cough, nausea. Denies history of underlying allergies. Is not a smoker. 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  Treatment to date: none.  ? sick contacts.  No other aggravating or relieving factors.    The following portions of the patient's history were reviewed and updated as appropriate: allergies, current medications, past medical history, past social history, past surgical history and problem list.  ROS as in subjective   Objective: Filed Vitals:   04/28/15 1146  BP: 112/80  Pulse: 64  Temp: 98.2 F (36.8 C)    General appearance: no distress, WD/WN, is not ill-appearing HEENT: normocephalic, conjunctiva/corneas normal, sclerae anicteric, nares patent, no discharge or erythema, pharynx with erythema, without exudate.  Oral cavity: MMM, no lesions  Neck: supple, no lymphadenopathy, no thyromegaly   Laboratory Strep test done. Results:negative.    Assessment and Plan:  Acute pharyngitis, unspecified etiology - Plan: POCT rapid strep A  Constipation, unspecified constipation type   Advised that symptoms and exam suggest a viral etiology.  Discussed symptomatic treatment including salt water gargles, warm fluids, rest, hydrate well, can use over-the-counter Tylenol or Ibuprofen for throat pain, fever, or malaise. If worse or not improving within 2-3 days, call or return. Discussed constipation and increasing fiber, fluids and exercise to improve  motility. She may also try miralax one cap full daily and see if this helps.  Follow up as needed.

## 2015-04-29 LAB — POCT RAPID STREP A (OFFICE): Rapid Strep A Screen: NEGATIVE

## 2015-05-04 ENCOUNTER — Encounter: Payer: Self-pay | Admitting: Family Medicine

## 2015-05-04 ENCOUNTER — Ambulatory Visit (INDEPENDENT_AMBULATORY_CARE_PROVIDER_SITE_OTHER): Payer: 59 | Admitting: Family Medicine

## 2015-05-04 VITALS — BP 140/80 | HR 72 | Temp 98.4°F | Wt 152.0 lb

## 2015-05-04 DIAGNOSIS — J069 Acute upper respiratory infection, unspecified: Secondary | ICD-10-CM

## 2015-05-04 DIAGNOSIS — R05 Cough: Secondary | ICD-10-CM | POA: Diagnosis not present

## 2015-05-04 DIAGNOSIS — R059 Cough, unspecified: Secondary | ICD-10-CM

## 2015-05-04 MED ORDER — PROMETHAZINE-DM 6.25-15 MG/5ML PO SYRP: 5.0000 mL | ORAL_SOLUTION | Freq: Every evening | ORAL | Status: DC | PRN

## 2015-05-04 MED ORDER — AZITHROMYCIN 250 MG PO TABS: ORAL_TABLET | ORAL | Status: DC

## 2015-05-04 NOTE — Progress Notes (Signed)
Subjective:  Janet French is a 30 y.o. female who presents for a 4 day history of productive cough, post nasal drainage, hoarse voice, and mild sore throat.    Denies fever, chills, body aches, ear pain, nausea, vomiting, diarrhea. Does not smoke. Flu shot received. Denies history of bronchitis or pneumonia  Treatment to date: cough suppressants.  Denies sick contacts.  No other aggravating or relieving factors.  No other c/o.  ROS as in subjective.   Objective: Filed Vitals:   05/04/15 1100  BP: 140/80  Pulse: 72  Temp: 98.4 F (36.9 C)    General appearance: Alert, WD/WN, no distress, mildly ill appearing                             Skin: warm, no rash                           Head: no sinus tenderness                            Eyes: conjunctiva normal, corneas clear, PERRLA                            Ears: pearly TMs, external ear canals normal                          Nose: septum midline, turbinates swollen, with erythema and clear discharge             Mouth/throat: MMM, tongue normal, mild pharyngeal erythema                           Neck: supple, no adenopathy, no thyromegaly, nontender                          Heart: RRR, normal S1, S2, no murmurs                         Lungs: CTA bilaterally, no wheezes, rales, or rhonchi     Assessment: Cough - Plan: azithromycin (ZITHROMAX Z-PAK) 250 MG tablet, promethazine-dextromethorphan (PROMETHAZINE-DM) 6.25-15 MG/5ML syrup  Acute upper respiratory infection - Plan: azithromycin (ZITHROMAX Z-PAK) 250 MG tablet  Plan: Discussed diagnosis and treatment of URI. Discussed viral vs bacterial etiology. Z-pak prescription sent to pharmacy with instructions to give her illness 2-3 more days and see if she continues to improve. Nighttime cough medication sent to pharmacy.  Suggested symptomatic OTC remedies. Salt water gargles for sore throat.  Nasal saline spray for congestion.  Tylenol or Ibuprofen OTC for fever and malaise.   Call/return when finishing the antibiotic if not back to baseline.

## 2015-05-04 NOTE — Patient Instructions (Signed)
Stay well hydrated, Delsym or Mucinex DM for cough and congestion during the daytime. I sent in an antibiotic to your pharmacy, give your illness 2-3 more days and see if you are improving if not start the antibiotic. You can use the cough medication I prescribed at bedtime but do not drink alcohol or drive with this.  Cough, Adult Coughing is a reflex that clears your throat and your airways. Coughing helps to heal and protect your lungs. It is normal to cough occasionally, but a cough that happens with other symptoms or lasts a long time may be a sign of a condition that needs treatment. A cough may last only 2-3 weeks (acute), or it may last longer than 8 weeks (chronic). CAUSES Coughing is commonly caused by:  Breathing in substances that irritate your lungs.  A viral or bacterial respiratory infection.  Allergies.  Asthma.  Postnasal drip.  Smoking.  Acid backing up from the stomach into the esophagus (gastroesophageal reflux).  Certain medicines.  Chronic lung problems, including COPD (or rarely, lung cancer).  Other medical conditions such as heart failure. HOME CARE INSTRUCTIONS  Pay attention to any changes in your symptoms. Take these actions to help with your discomfort:  Take medicines only as told by your health care provider.  If you were prescribed an antibiotic medicine, take it as told by your health care provider. Do not stop taking the antibiotic even if you start to feel better.  Talk with your health care provider before you take a cough suppressant medicine.  Drink enough fluid to keep your urine clear or pale yellow.  If the air is dry, use a cold steam vaporizer or humidifier in your bedroom or your home to help loosen secretions.  Avoid anything that causes you to cough at work or at home.  If your cough is worse at night, try sleeping in a semi-upright position.  Avoid cigarette smoke. If you smoke, quit smoking. If you need help quitting, ask  your health care provider.  Avoid caffeine.  Avoid alcohol.  Rest as needed. SEEK MEDICAL CARE IF:   You have new symptoms.  You cough up pus.  Your cough does not get better after 2-3 weeks, or your cough gets worse.  You cannot control your cough with suppressant medicines and you are losing sleep.  You develop pain that is getting worse or pain that is not controlled with pain medicines.  You have a fever.  You have unexplained weight loss.  You have night sweats. SEEK IMMEDIATE MEDICAL CARE IF:  You cough up blood.  You have difficulty breathing.  Your heartbeat is very fast.   This information is not intended to replace advice given to you by your health care provider. Make sure you discuss any questions you have with your health care provider.   Document Released: 08/06/2010 Document Revised: 10/29/2014 Document Reviewed: 04/16/2014 Elsevier Interactive Patient Education [employer]2016 Elsevier Inc.

## 2015-05-12 ENCOUNTER — Telehealth: Payer: Self-pay | Admitting: Family Medicine

## 2015-05-12 NOTE — Telephone Encounter (Signed)
[facility]. Sending back for review. Please note what needs to be abstracted or scanned.

## 2015-06-01 ENCOUNTER — Ambulatory Visit (INDEPENDENT_AMBULATORY_CARE_PROVIDER_SITE_OTHER): Payer: 59 | Admitting: Family Medicine

## 2015-06-01 ENCOUNTER — Encounter: Payer: Self-pay | Admitting: Family Medicine

## 2015-06-01 VITALS — BP 118/72 | HR 68 | Wt 152.0 lb

## 2015-06-01 DIAGNOSIS — Z8742 Personal history of other diseases of the female genital tract: Secondary | ICD-10-CM

## 2015-06-01 DIAGNOSIS — N939 Abnormal uterine and vaginal bleeding, unspecified: Secondary | ICD-10-CM | POA: Diagnosis not present

## 2015-06-01 LAB — CBC WITH DIFFERENTIAL/PLATELET
BASOS ABS: 45 {cells}/uL (ref 0–200)
BASOS PCT: 1 %
EOS PCT: 2 %
Eosinophils Absolute: 90 cells/uL (ref 15–500)
HCT: 35.5 % (ref 35.0–45.0)
Hemoglobin: 11.5 g/dL — ABNORMAL LOW (ref 11.7–15.5)
Lymphocytes Relative: 48 %
Lymphs Abs: 2160 cells/uL (ref 850–3900)
MCH: 28.2 pg (ref 27.0–33.0)
MCHC: 32.4 g/dL (ref 32.0–36.0)
MCV: 87 fL (ref 80.0–100.0)
MONOS PCT: 7 %
MPV: 10.4 fL (ref 7.5–12.5)
Monocytes Absolute: 315 cells/uL (ref 200–950)
NEUTROS ABS: 1890 {cells}/uL (ref 1500–7800)
Neutrophils Relative %: 42 %
PLATELETS: 290 10*3/uL (ref 140–400)
RBC: 4.08 MIL/uL (ref 3.80–5.10)
RDW: 13.3 % (ref 11.0–15.0)
WBC: 4.5 10*3/uL (ref 4.0–10.5)

## 2015-06-01 LAB — POCT URINE PREGNANCY: PREG TEST UR: NEGATIVE

## 2015-06-01 LAB — TSH: TSH: 0.67 m[IU]/L

## 2015-06-01 NOTE — Progress Notes (Signed)
   Subjective:    Patient ID: Janet NeatKristy R Kosel, female    DOB: 10/02/1985, 30 y.o.   MRN: [id number]  HPI Chief Complaint  Patient presents with  . irregular periods    irregular periods since july. but took a friends BC and it helped regular it. took 2 months work but been irregular since february again   She is here with complaints of irregular periods since July 2016. States she has been bleeding daily since February. States some days are heavy, usually about 5 days per month and goes through 4-5 pads those days and then will have some mild spotting on the other days  She has a history of irregular periods while on depo-provera and stopped injections in 2015. She states she did not have cycles while on these.  She states she had a mirena for a short period but did not like this.  States she took a friend's OCP for 2 months in December and January and these controlled her period. She states she tried OCPs in high school for birth control.    Denies fever, chills, sweats, fatigue, abdominal pain, back pain, chest pain, shortness of breath.  Denies personal history of seizures, blood clots, hypertension. Does not smoke.  Father with seizure history, HTN, DM.   She states she has seen a gynecologist in past only for tubal ligation.  States she cannot take ibuprofen, it upsets her stomach.  Last pap smear- fall 2016.  States she has history of abnormal cells on pap smear. She also reports history of colposcopy.  Denies history of STIs.  No new sexual partners.   Had a tubal ligation may 2016.  2 live births, vaginal delivery. Ages 89 and 5.   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    Review of Systems Pertinent positives and negatives in the history of present illness.     Objective:   Physical Exam  Constitutional: She appears well-developed and well-nourished. No distress.  Abdominal: Soft. Bowel sounds are normal. She exhibits no distension. There is no tenderness. There  is no rebound and no guarding.  Genitourinary: Uterus is not enlarged and not tender. Cervix exhibits no motion tenderness, no discharge and no friability. Right adnexum displays no mass, no tenderness and no fullness. Left adnexum displays no mass, no tenderness and no fullness. There is bleeding in the vagina. No tenderness in the vagina.   BP 118/72 mmHg  Pulse 68  Wt 152 lb (68.947 kg)  Urine pregnancy negative.      Assessment & Plan:  Abnormal vaginal bleeding - Plan: CBC with Differential/Platelet, POCT urine pregnancy, GC/Chlamydia Probe Amp, TSH, Ambulatory referral to Gynecology  History of abnormal cervical Pap smear - Plan: Ambulatory referral to Gynecology  Discussed that we will rule out STIs, pregnancy (negative) and check for anemia due to duration of bleeding. Will refer to Gynecologist for further evaluation and treatment of AUB. Discussed that she also has history of abnormal pap smears and I recommend that she is followed by Gynecologist for this as well. Will follow up pending labs.

## 2015-06-01 NOTE — Patient Instructions (Signed)
We will call you with lab results. I am referring you to gynecologist for further evaluation of abnormal bleeding and birth control options.   Dysfunctional Uterine Bleeding Dysfunctional uterine bleeding is abnormal bleeding from the uterus. Dysfunctional uterine bleeding includes:  A period that comes earlier or later than usual.  A period that is lighter, heavier, or has blood clots.  Bleeding between periods.  Skipping one or more periods.  Bleeding after sexual intercourse.  Bleeding after menopause. HOME CARE INSTRUCTIONS  Pay attention to any changes in your symptoms. Follow these instructions to help with your condition: Eating  Eat well-balanced meals. Include foods that are high in iron, such as liver, meat, shellfish, green leafy vegetables, and eggs.  If you become constipated:  Drink plenty of water.  Eat fruits and vegetables that are high in water and fiber, such as spinach, carrots, raspberries, apples, and mango. Medicines  Take over-the-counter and prescription medicines only as told by your health care provider.  Do not change medicines without talking with your health care provider.  Aspirin or medicines that contain aspirin may make the bleeding worse. Do not take those medicines:  During the week before your period.  During your period.  If you were prescribed iron pills, take them as told by your health care provider. Iron pills help to replace iron that your body loses because of this condition. Activity  If you need to change your sanitary pad or tampon more than one time every 2 hours:  Lie in bed with your feet raised (elevated).  Place a cold pack on your lower abdomen.  Rest as much as possible until the bleeding stops or slows down.  Do not try to lose weight until the bleeding has stopped and your blood iron level is back to normal. Other Instructions  For two months, write down:  When your period starts.  When your period  ends.  When any abnormal bleeding occurs.  What problems you notice.  Keep all follow up visits as told by your health care provider. This is important. SEEK MEDICAL CARE IF:  You get light-headed or weak.  You have nausea and vomiting.  You cannot eat or drink without vomiting.  You feel dizzy or have diarrhea while you are taking medicines.  You are taking birth control pills or hormones, and you want to change them or stop taking them. SEEK IMMEDIATE MEDICAL CARE IF:  You develop a fever or chills.  You need to change your sanitary pad or tampon more than one time per hour.  Your bleeding becomes heavier, or your flow contains clots more often.  You develop pain in your abdomen.  You lose consciousness.  You develop a rash.   This information is not intended to replace advice given to you by your health care provider. Make sure you discuss any questions you have with your health care provider.   Document Released: 02/05/2000 Document Revised: 10/29/2014 Document Reviewed: 05/05/2014 Elsevier Interactive Patient Education [employer]2016 Elsevier Inc.

## 2015-06-02 LAB — GC/CHLAMYDIA PROBE AMP
CT Probe RNA: NOT DETECTED
GC Probe RNA: NOT DETECTED

## 2015-06-15 ENCOUNTER — Encounter: Payer: Self-pay | Admitting: Family Medicine

## 2015-06-23 ENCOUNTER — Other Ambulatory Visit (HOSPITAL_COMMUNITY)
Admission: RE | Admit: 2015-06-23 | Discharge: 2015-06-23 | Disposition: A | Discharge status: Home / Self Care | Payer: 59 | Source: Ambulatory Visit | Attending: Obstetrics & Gynecology | Admitting: Obstetrics & Gynecology

## 2015-06-23 ENCOUNTER — Other Ambulatory Visit: Payer: Self-pay | Admitting: Obstetrics & Gynecology

## 2015-06-23 DIAGNOSIS — N939 Abnormal uterine and vaginal bleeding, unspecified: Secondary | ICD-10-CM | POA: Diagnosis not present

## 2015-06-23 DIAGNOSIS — Z01411 Encounter for gynecological examination (general) (routine) with abnormal findings: Secondary | ICD-10-CM | POA: Insufficient documentation

## 2015-06-23 DIAGNOSIS — R87612 Low grade squamous intraepithelial lesion on cytologic smear of cervix (LGSIL): Secondary | ICD-10-CM | POA: Diagnosis not present

## 2015-06-23 DIAGNOSIS — Z1151 Encounter for screening for human papillomavirus (HPV): Secondary | ICD-10-CM | POA: Insufficient documentation

## 2015-06-25 LAB — CYTOLOGY - PAP

## 2015-10-16 ENCOUNTER — Emergency Department (HOSPITAL_COMMUNITY)
Admission: EM | Admit: 2015-10-16 | Discharge: 2015-10-16 | Disposition: A | Discharge status: Home / Self Care | Payer: 59 | Attending: Emergency Medicine | Admitting: Emergency Medicine

## 2015-10-16 ENCOUNTER — Encounter (HOSPITAL_COMMUNITY): Payer: Self-pay | Admitting: *Deleted

## 2015-10-16 DIAGNOSIS — J029 Acute pharyngitis, unspecified: Secondary | ICD-10-CM | POA: Insufficient documentation

## 2015-10-16 LAB — RAPID STREP SCREEN (MED CTR MEBANE ONLY): STREPTOCOCCUS, GROUP A SCREEN (DIRECT): NEGATIVE

## 2015-10-16 MED ORDER — LIDOCAINE VISCOUS 2 % MT SOLN: 20.0000 mL | OROMUCOSAL | 0 refills | Status: DC | PRN

## 2015-10-16 NOTE — ED Provider Notes (Signed)
[facility]-EMERGENCY DEPT Provider Note   CSN: [id number] Arrival date & time: 10/16/15  0200     History   Chief Complaint Chief Complaint  Patient presents with  . Sore Throat    HPI Janet French is a 30 y.o. female.  Patient presents today with a chief complaint of sore throat onset yesterday.  Pain gradually worsening since that time.  She tried taking Catering manager without improvement.  She states that the pain is worse with swallowing, but she has been able to swallow.  She denies fever, chills, neck stiffness, nausea, vomiting, cough, SOB, or any other symptoms at this time.  She does have a prior history of Strep.       History reviewed. No pertinent past medical history.  Patient Active Problem List   Diagnosis Date Noted  . Low grade squamous intraepithelial lesion (LGSIL) on cervical Pap smear 07/23/2013    Past Surgical History:  Procedure Laterality Date  . LAPAROSCOPIC TUBAL LIGATION Bilateral 07/16/2014   Procedure: LAPAROSCOPIC TUBAL LIGATION;  Surgeon: Myna Hidalgo, DO;  Location: [facility];  Service: Gynecology;  Laterality: Bilateral;  . wisdom teeth removal  2003  . WISDOM TOOTH EXTRACTION      OB History    Gravida Para Term Preterm AB Living   3 2 2   1 2    SAB TAB Ectopic Multiple Live Births     1             Home Medications    Prior to Admission medications   Not on File    Family History Family History  Problem Relation Age of Onset  . Hypertension Father   . Seizures Father   . Cancer Maternal Grandmother     Social History Social History  Substance Use Topics  . Smoking status: Never Smoker  . Smokeless tobacco: Never Used  . Alcohol use No     Comment: occasionally     Allergies   Review of patient's allergies indicates no known allergies.   Review of Systems Review of Systems  All other systems reviewed and are negative.    Physical Exam Updated Vital Signs BP 118/85   Pulse (!) 58   Temp 98.5 F (36.9 C)  (Oral)   Resp 14   Ht 4\' 11"  (1.499 m)   Wt 72.6 kg   SpO2 100%   BMI 32.32 kg/m   Physical Exam  Constitutional: She appears well-developed and well-nourished.  HENT:  Head: Normocephalic and atraumatic.  Right Ear: Tympanic membrane and ear canal normal.  Left Ear: Tympanic membrane and ear canal normal.  Nose: Nose normal.  Mouth/Throat: Uvula is midline and mucous membranes are normal. No trismus in the jaw. No uvula swelling. Posterior oropharyngeal edema and posterior oropharyngeal erythema present. No oropharyngeal exudate or tonsillar abscesses.  Mild erythema of the oropharynx Normal voice phonation Handling secretions well, no drooling  Neck: Normal range of motion. Neck supple.  Cardiovascular: Normal rate, regular rhythm and normal heart sounds.   Pulmonary/Chest: Effort normal and breath sounds normal.  Musculoskeletal: Normal range of motion.  Neurological: She is alert.  Skin: Skin is warm and dry.  Psychiatric: She has a normal mood and affect.  Nursing note and vitals reviewed.    ED Treatments / Results  Labs (all labs ordered are listed, but only abnormal results are displayed) Labs Reviewed  RAPID STREP SCREEN (NOT AT [facility])  CULTURE, GROUP A STREP [facility])    EKG  EKG Interpretation None  Radiology No results found.  Procedures Procedures (including critical care time)  Medications Ordered in ED Medications - No data to display   Initial Impression / Assessment and Plan / ED Course  I have reviewed the triage vital signs and the nursing notes.  Pertinent labs & imaging results that were available during my care of the patient were reviewed by me and considered in my medical decision making (see chart for details).  Clinical Course      Final Clinical Impressions(s) / ED Diagnoses   Final diagnoses:  None   Patient presents today with a sore throat onset yesterday.  Rapid strep is negative.  No signs of PTA or retropharyngeal  abscess.  Patient stable for discharge.  Return precautions given.    New Prescriptions New Prescriptions   No medications on file     Santiago GladHeather Emika Tiano, PA-C 10/16/15 0502    Azalia BilisKevin Campos, MD 10/16/15 [phone]

## 2015-10-16 NOTE — ED Triage Notes (Signed)
Pt c/o sore throat starting today.  °

## 2015-10-18 LAB — CULTURE, GROUP A STREP (THRC)

## 2016-05-27 ENCOUNTER — Encounter (HOSPITAL_COMMUNITY): Payer: Self-pay | Admitting: *Deleted

## 2017-01-19 ENCOUNTER — Other Ambulatory Visit: Payer: Self-pay | Admitting: Obstetrics & Gynecology

## 2017-02-08 ENCOUNTER — Other Ambulatory Visit: Payer: Self-pay | Admitting: Obstetrics & Gynecology

## 2019-08-02 DIAGNOSIS — N898 Other specified noninflammatory disorders of vagina: Secondary | ICD-10-CM | POA: Diagnosis not present

## 2019-11-18 ENCOUNTER — Other Ambulatory Visit: Payer: 59

## 2019-11-18 ENCOUNTER — Other Ambulatory Visit: Payer: Self-pay

## 2019-11-18 DIAGNOSIS — Z20822 Contact with and (suspected) exposure to covid-19: Secondary | ICD-10-CM

## 2019-11-19 LAB — SARS-COV-2, NAA 2 DAY TAT

## 2019-11-19 LAB — NOVEL CORONAVIRUS, NAA: SARS-CoV-2, NAA: NOT DETECTED

## 2020-12-08 ENCOUNTER — Ambulatory Visit (INDEPENDENT_AMBULATORY_CARE_PROVIDER_SITE_OTHER): Payer: 59 | Admitting: Obstetrics & Gynecology

## 2020-12-08 ENCOUNTER — Other Ambulatory Visit: Payer: Self-pay

## 2020-12-08 ENCOUNTER — Other Ambulatory Visit (HOSPITAL_COMMUNITY)
Admission: RE | Admit: 2020-12-08 | Discharge: 2020-12-08 | Disposition: A | Discharge status: Home / Self Care | Payer: 59 | Source: Ambulatory Visit | Attending: Obstetrics & Gynecology | Admitting: Obstetrics & Gynecology

## 2020-12-08 ENCOUNTER — Encounter: Payer: Self-pay | Admitting: Obstetrics & Gynecology

## 2020-12-08 VITALS — BP 118/79 | HR 69 | Ht 60.0 in | Wt 168.2 lb

## 2020-12-08 DIAGNOSIS — Z01419 Encounter for gynecological examination (general) (routine) without abnormal findings: Secondary | ICD-10-CM | POA: Insufficient documentation

## 2020-12-08 NOTE — Progress Notes (Signed)
   WELL-WOMAN EXAMINATION Patient name: Janet French MRN [id number]  Date of birth: 1985/07/31 Chief Complaint:   Gynecologic Exam  History of Present Illness:   Janet French is a 35 y.o. [phone]  female being seen today for a routine well-woman exam.  Today she notes: no acute complaints or concerns  Prior [facility] patient- h/o cervical dysplasia Pt prefers yearly pap test.  See below: LEEP- 01/2017- CIN 3 Last pap/HPV 02/2019 negative Pap 2020 negative  Patient's last menstrual period was 11/16/2020. Denies issues with her menses The current method of family planning is tubal ligation.    Last pap 02/2019.  Last mammogram: n/a. 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  Depression screen [facility] 2/9 12/08/2020  Decreased Interest 0  Down, Depressed, Hopeless 0  PHQ - 2 Score 0  Altered sleeping 1  Tired, decreased energy 1  Change in appetite 0  Feeling bad or failure about yourself  0  Trouble concentrating 1  Moving slowly or fidgety/restless 0  Suicidal thoughts 0  PHQ-9 Score 3      Review of Systems:   Pertinent items are noted in HPI Denies any headaches, blurred vision, fatigue, shortness of breath, chest pain, abdominal pain, bowel movements, urination, or intercourse unless otherwise stated above.  Pertinent History Reviewed:  Reviewed past medical,surgical, social and family history.  Reviewed problem list, medications and allergies. Physical Assessment:   Vitals:   12/08/20 1340  BP: 118/79  Pulse: 69  Weight: 168 lb 3.2 oz (76.3 kg)  Height: 5' (1.524 m)  Body mass index is 32.85 kg/m.        Physical Examination:   General appearance - well appearing, and in no distress  Mental status - alert, oriented to person, place, and time  Psych:  She has a normal mood and affect  Skin - warm and dry, normal color, no suspicious lesions noted  Chest - effort  normal, all lung fields clear to auscultation bilaterally  Heart - normal rate and regular rhythm  Neck:  midline trachea, no thyromegaly or nodules  Breasts - breasts appear normal, no suspicious masses, no skin or nipple changes or  axillary nodes  Abdomen - soft, nontender, nondistended, no masses or organomegaly  Pelvic - VULVA: normal appearing vulva with no masses, tenderness or lesions  VAGINA: normal appearing vagina with normal color and discharge, no lesions  CERVIX: normal appearing cervix without discharge or lesions, no CMT  Thin prep pap is done with HR HPV cotesting  UTERUS: uterus is felt to be normal size, shape, consistency and nontender   ADNEXA: No adnexal masses or tenderness noted.  Extremities:  No swelling or varicosities noted  Chaperone: Faith Rogue     Assessment & Plan:  1) Well-Woman Exam -pap collected, reviewed screening guidelines- may consider q7yr testing  2) prior h/o recurrent vaginitis- currently asymptomatic  Meds: No orders of the defined types were placed in this encounter.   Follow-up: Return in about 1 year (around 12/08/2021) for Annual, with Dr. Charlotta Newton.   Myna Hidalgo, DO Attending Obstetrician & Gynecologist, [facility] for [employer], [facility]

## 2020-12-11 LAB — CYTOLOGY - PAP
Comment: NEGATIVE
Diagnosis: NEGATIVE
High risk HPV: NEGATIVE

## 2021-04-19 ENCOUNTER — Other Ambulatory Visit (HOSPITAL_COMMUNITY)
Admission: RE | Admit: 2021-04-19 | Discharge: 2021-04-19 | Disposition: A | Discharge status: Home / Self Care | Payer: 59 | Source: Ambulatory Visit | Attending: Obstetrics & Gynecology | Admitting: Obstetrics & Gynecology

## 2021-04-19 ENCOUNTER — Other Ambulatory Visit: Payer: Self-pay

## 2021-04-19 ENCOUNTER — Other Ambulatory Visit (INDEPENDENT_AMBULATORY_CARE_PROVIDER_SITE_OTHER): Payer: 59

## 2021-04-19 DIAGNOSIS — N898 Other specified noninflammatory disorders of vagina: Secondary | ICD-10-CM | POA: Insufficient documentation

## 2021-04-19 NOTE — Progress Notes (Signed)
° °  NURSE VISIT- VAGINITIS/STD  SUBJECTIVE:  Janet French is a 36 y.o. [id number] GYN patientfemale here for a vaginal swab for vaginitis screening, STD screen.  She reports the following symptoms: discharge described as clear and vulvar erythema noted, local irritation, pain, and vulvar itching for 4 days. Denies abnormal vaginal bleeding, significant pelvic pain, fever, or UTI symptoms.  OBJECTIVE:  There were no vitals taken for this visit.  Appears well, in no apparent distress  ASSESSMENT: Vaginal swab for  vaginitis & STD screening  PLAN: Self-collected vaginal probe for Gonorrhea, Chlamydia, Trichomonas, Bacterial Vaginosis, Yeast sent to lab Treatment: to be determined once results are received Follow-up as needed if symptoms persist/worsen, or new symptoms develop  Lundy Cozart A Lucero Ide  04/19/2021 11:55 AM

## 2021-04-20 ENCOUNTER — Other Ambulatory Visit: Payer: Self-pay | Admitting: Adult Health

## 2021-04-20 LAB — CERVICOVAGINAL ANCILLARY ONLY
Bacterial Vaginitis (gardnerella): POSITIVE — AB
Candida Glabrata: NEGATIVE
Candida Vaginitis: NEGATIVE
Chlamydia: NEGATIVE
Comment: NEGATIVE
Comment: NEGATIVE
Comment: NEGATIVE
Comment: NEGATIVE
Comment: NEGATIVE
Comment: NORMAL
Neisseria Gonorrhea: NEGATIVE
Trichomonas: NEGATIVE

## 2021-04-20 MED ORDER — METRONIDAZOLE 500 MG PO TABS: 500.0000 mg | ORAL_TABLET | Freq: Two times a day (BID) | ORAL | 0 refills | Status: DC

## 2021-04-20 NOTE — Progress Notes (Signed)
+  BV on vaginal swab will  rx flagyl,no sex or alcohol during treatment  °

## 2023-04-09 ENCOUNTER — Ambulatory Visit (HOSPITAL_COMMUNITY)
Admission: RE | Admit: 2023-04-09 | Discharge: 2023-04-09 | Discharge status: Home / Self Care | Payer: 59 | Source: Ambulatory Visit | Attending: Emergency Medicine | Admitting: Emergency Medicine

## 2023-04-09 ENCOUNTER — Encounter (HOSPITAL_COMMUNITY): Payer: Self-pay

## 2023-04-09 VITALS — BP 118/78 | HR 77 | Temp 98.5°F | Resp 16

## 2023-04-09 DIAGNOSIS — R051 Acute cough: Secondary | ICD-10-CM

## 2023-04-09 DIAGNOSIS — J069 Acute upper respiratory infection, unspecified: Secondary | ICD-10-CM

## 2023-04-09 MED ORDER — PROMETHAZINE-DM 6.25-15 MG/5ML PO SYRP: 5.0000 mL | ORAL_SOLUTION | Freq: Every evening | ORAL | 0 refills | Status: DC | PRN

## 2023-04-09 MED ORDER — AZITHROMYCIN 250 MG PO TABS: ORAL_TABLET | ORAL | 0 refills | Status: DC

## 2023-04-09 NOTE — Discharge Instructions (Signed)
 Start taking azithromycin by taking 2 tablets today and 1 tablet on the remaining 4 days.   I have also prescribed promethazine DM cough syrup that you can take at night for cough.  This can make you drowsy so do not take while driving.  Otherwise alternate between Tylenol and ibuprofen as needed for pain and fever.  I also recommend taking Mucinex to help with cough and congestion.  Return here if symptoms persist or worsen.

## 2023-04-09 NOTE — ED Triage Notes (Signed)
 Chief Complaint: Painful dry cough with incontinence, sore throat, restlessness, pain with eating/drinking. Patient tested negative for flu and COVID at the pharmacy.   Sick exposure: Yes- Son was positive for the flu.  Onset: 2 weeks  Prescriptions or OTC medications tried: Yes- Delsym, day/Nyquil, [facility] cold and cough, Benzonatate, cough drops      with little relief  New foods, medications, or products: No  Recent Travel: No

## 2023-04-09 NOTE — ED Provider Notes (Signed)
 [facility]    CSN: [id number] Arrival date & time: 04/09/23  1528      History   Chief Complaint Chief Complaint  Patient presents with   Cough   Appointment    HPI Janet French is a 38 y.o. female.   Patient presents with productive cough with thick yellow/green sputum, sore throat, and congestion that began on 2/6.  Denies shortness of breath, chest pain, lethargy, and fever.   Cough   Past Medical History:  Diagnosis Date   Vaginal Pap smear, abnormal     Patient Active Problem List   Diagnosis Date Noted   Low grade squamous intraepithelial lesion (LGSIL) on cervical Pap smear 07/23/2013    Past Surgical History:  Procedure Laterality Date   LAPAROSCOPIC TUBAL LIGATION Bilateral 07/16/2014   Procedure: LAPAROSCOPIC TUBAL LIGATION;  Surgeon: Myna Hidalgo, DO;  Location: [facility];  Service: Gynecology;  Laterality: Bilateral;   wisdom teeth removal  2003   WISDOM TOOTH EXTRACTION      OB History     Gravida  3   Para  2   Term  2   Preterm      AB  1   Living  2      SAB      IAB  1   Ectopic      Multiple      Live Births  2            Home Medications    Prior to Admission medications   Medication Sig Start Date End Date Taking? Authorizing Provider  azithromycin (ZITHROMAX Z-PAK) 250 MG tablet Take 2 pills (500mg ) first day and one pill (250mg ) the remaining 4 days. 04/09/23  Yes Wynonia Lawman A, NP  promethazine-dextromethorphan (PROMETHAZINE-DM) 6.25-15 MG/5ML syrup Take 5 mLs by mouth at bedtime as needed for cough. 04/09/23  Yes Susann Givens, Sonia Bromell A, NP  MedroxyPROGESTERone Acetate (DEPO-PROVERA IM) Inject 1 Syringe into the muscle every 30 (thirty) days. Had monthly injection on wed 12/19  05/01/11  [provider]    Family History Family History  Problem Relation Age of Onset   Esophageal cancer Maternal Grandmother    Hypertension Father    Seizures Father     Social History Social History    Tobacco Use   Smoking status: Never   Smokeless tobacco: Never  Vaping Use   Vaping status: Never Used  Substance Use Topics   Alcohol use: No    Comment: occasionally   Drug use: No     Allergies   Patient has no known allergies.   Review of Systems Review of Systems  Respiratory:  Positive for cough.    Per HPI  Physical Exam Triage Vital Signs ED Triage Vitals  Encounter Vitals Group     BP 04/09/23 1600 118/78     Systolic BP Percentile --      Diastolic BP Percentile --      Pulse Rate 04/09/23 1600 77     Resp 04/09/23 1600 16     Temp 04/09/23 1600 98.5 F (36.9 C)     Temp Source 04/09/23 1600 Oral     SpO2 04/09/23 1600 97 %     Weight --      Height --      Head Circumference --      Peak Flow --      Pain Score 04/09/23 1559 0     Pain Loc --  Pain Education --      Exclude from Growth Chart --    No data found.  Updated Vital Signs BP 118/78 (BP Location: Left Arm)   Pulse 77   Temp 98.5 F (36.9 C) (Oral)   Resp 16   LMP 03/20/2023 (Approximate)   SpO2 97%   Visual Acuity Right Eye Distance:   Left Eye Distance:   Bilateral Distance:    Right Eye Near:   Left Eye Near:    Bilateral Near:     Physical Exam Vitals and nursing note reviewed.  Constitutional:      General: She is awake. She is not in acute distress.    Appearance: Normal appearance. She is well-developed and well-groomed. She is not ill-appearing.  HENT:     Right Ear: Tympanic membrane, ear canal and external ear normal.     Left Ear: Tympanic membrane, ear canal and external ear normal.     Nose: Congestion and rhinorrhea present.     Mouth/Throat:     Mouth: Mucous membranes are moist.     Pharynx: Posterior oropharyngeal erythema present. No oropharyngeal exudate.  Cardiovascular:     Rate and Rhythm: Normal rate and regular rhythm.  Pulmonary:     Effort: Pulmonary effort is normal.     Breath sounds: Normal breath sounds.  Musculoskeletal:         General: Normal range of motion.  Skin:    General: Skin is warm and dry.  Neurological:     Mental Status: She is alert.  Psychiatric:        Behavior: Behavior is cooperative.      UC Treatments / Results  Labs (all labs ordered are listed, but only abnormal results are displayed) Labs Reviewed - No data to display  EKG   Radiology No results found.  Procedures Procedures (including critical care time)  Medications Ordered in UC Medications - No data to display  Initial Impression / Assessment and Plan / UC Course  I have reviewed the triage vital signs and the nursing notes.  Pertinent labs & imaging results that were available during my care of the patient were reviewed by me and considered in my medical decision making (see chart for details).     Patient presented with productive cough with thick yellow/green sputum, sore throat, and congestion that began on 2/6.  Denies any other symptoms.  Upon assessment congestion and rhinorrhea are present, mild erythema noted to pharynx.  Lungs clear bilaterally on auscultation.  Prescribed azithromycin for persistent upper respiratory infection.  Prescribed Promethazine DM as needed for cough at night.  Discussed over-the-counter medications for symptoms.  Discussed return precautions. Final Clinical Impressions(s) / UC Diagnoses   Final diagnoses:  Acute upper respiratory infection  Acute cough     Discharge Instructions      Start taking azithromycin by taking 2 tablets today and 1 tablet on the remaining 4 days.   I have also prescribed promethazine DM cough syrup that you can take at night for cough.  This can make you drowsy so do not take while driving.  Otherwise alternate between Tylenol and ibuprofen as needed for pain and fever.  I also recommend taking Mucinex to help with cough and congestion.  Return here if symptoms persist or worsen.     ED Prescriptions     Medication Sig Dispense Auth. Provider    azithromycin (ZITHROMAX Z-PAK) 250 MG tablet Take 2 pills (500mg ) first day and one pill (250mg )  the remaining 4 days. 6 tablet Wynonia Lawman A, NP   promethazine-dextromethorphan (PROMETHAZINE-DM) 6.25-15 MG/5ML syrup Take 5 mLs by mouth at bedtime as needed for cough. 118 mL Wynonia Lawman A, NP      PDMP not reviewed this encounter.   Wynonia Lawman A, NP 04/09/23 1635

## 2023-05-22 ENCOUNTER — Other Ambulatory Visit (HOSPITAL_COMMUNITY): Payer: Self-pay

## 2023-05-22 MED ORDER — WEGOVY 0.25 MG/0.5ML ~~LOC~~ SOAJ
0.2500 mg | SUBCUTANEOUS | 0 refills | Status: AC
  Filled 2023-05-22 – 2023-06-01 (×4): qty 2, 28d supply, fill #0

## 2023-06-01 ENCOUNTER — Other Ambulatory Visit (HOSPITAL_COMMUNITY): Payer: Self-pay

## 2023-06-20 ENCOUNTER — Other Ambulatory Visit (HOSPITAL_COMMUNITY): Payer: Self-pay

## 2023-06-20 MED ORDER — WEGOVY 0.5 MG/0.5ML ~~LOC~~ SOAJ
0.5000 mg | SUBCUTANEOUS | 0 refills | Status: AC
  Filled 2023-06-20 – 2023-06-26 (×3): qty 2, 28d supply, fill #0

## 2023-06-20 MED ORDER — VITAMIN D (ERGOCALCIFEROL) 1.25 MG (50000 UNIT) PO CAPS
50000.0000 [IU] | ORAL_CAPSULE | ORAL | 5 refills | Status: AC
  Filled 2023-06-20: qty 4, 28d supply, fill #0
  Filled 2023-07-20: qty 4, 28d supply, fill #1
  Filled 2023-08-21 – 2023-08-22 (×2): qty 4, 28d supply, fill #2
  Filled 2023-09-19: qty 4, 28d supply, fill #3
  Filled 2023-10-20 – 2023-11-23 (×2): qty 4, 28d supply, fill #4
  Filled 2023-12-26: qty 4, 28d supply, fill #5

## 2023-06-26 ENCOUNTER — Other Ambulatory Visit (HOSPITAL_COMMUNITY): Payer: Self-pay

## 2023-07-20 ENCOUNTER — Other Ambulatory Visit (HOSPITAL_COMMUNITY): Payer: Self-pay

## 2023-07-20 MED ORDER — WEGOVY 1 MG/0.5ML ~~LOC~~ SOAJ
1.0000 mg | SUBCUTANEOUS | 0 refills | Status: AC
  Filled 2023-07-20: qty 2, 28d supply, fill #0

## 2023-08-10 ENCOUNTER — Telehealth: Payer: Self-pay

## 2023-08-10 NOTE — Telephone Encounter (Signed)
 Call to patient as she is on Dr. Charl Concha schedule for 08/14/23 as a self-referral. Chart says patient has no PCP. Appointment notes state she is asking to be seen for coronary artery blockage but no documentation in the chart of previous work up or ED visits for chest pain. No answer, no DPR on file, left message with no identifiers asking recipient to call [place] at our office number.  Also sent MyChart message explaining that we need PCP referral or documentation of where the coronary artery blockage diagnosis comes from. Also advised of ED precautions.

## 2023-08-14 ENCOUNTER — Ambulatory Visit: Payer: Self-pay | Admitting: Cardiology

## 2023-08-15 ENCOUNTER — Ambulatory Visit: Attending: Cardiology | Admitting: Cardiology

## 2023-08-15 ENCOUNTER — Encounter: Payer: Self-pay | Admitting: Cardiology

## 2023-08-15 VITALS — BP 120/72 | HR 64 | Ht 60.0 in | Wt 174.2 lb

## 2023-08-15 DIAGNOSIS — R079 Chest pain, unspecified: Secondary | ICD-10-CM

## 2023-08-15 DIAGNOSIS — I4510 Unspecified right bundle-branch block: Secondary | ICD-10-CM | POA: Diagnosis not present

## 2023-08-15 NOTE — Patient Instructions (Signed)
 Medication Instructions:  Your physician recommends that you continue on your current medications as directed. Please refer to the Current Medication list given to you today.   *If you need a refill on your cardiac medications before your next appointment, please call your pharmacy*  Lab Work: No labs ordered today  If you have labs (blood work) drawn today and your tests are completely normal, you will receive your results only by: MyChart Message (if you have MyChart) OR A paper copy in the mail If you have any lab test that is abnormal or we need to change your treatment, we will call you to review the results.  Testing/Procedures: Your physician has requested that you have an echocardiogram. Echocardiography is a painless test that uses sound waves to create images of your heart. It provides your doctor with information about the size and shape of your heart and how well your heart's chambers and valves are working.   You may receive an ultrasound enhancing agent through an IV if needed to better visualize your heart during the echo. This procedure takes approximately one hour.  There are no restrictions for this procedure.  This will take place at [address] [facility] [address]) [address], [place] [address]  Please note: We ask at that you not bring children with you during ultrasound (echo/ vascular) testing. Due to room size and safety concerns, children are not allowed in the ultrasound rooms during exams. Our front office staff cannot provide observation of children in our lobby area while testing is being conducted. An adult accompanying a patient to their appointment will only be allowed in the ultrasound room at the discretion of the ultrasound technician under special circumstances. We apologize for any inconvenience.   Follow-Up: At [facility], you and your health needs are our priority.  As part of our continuing mission to provide you with exceptional heart  care, our providers are all part of one team.  This team includes your primary Cardiologist (physician) and Advanced Practice Providers or APPs (Physician Assistants and Nurse Practitioners) who all work together to provide you with the care you need, when you need it.  Your next appointment:   2 month(s)  Provider:   You may see Dr. Darliss or one of the following Advanced Practice Providers on your designated Care Team:   Lonni Meager, NP Lesley Maffucci, PA-C Bernardino Bring, PA-C Cadence [place], PA-C Tylene Lunch, NP Barnie Hila, NP    We recommend signing up for the patient portal called MyChart.  Sign up information is provided on this After Visit Summary.  MyChart is used to connect with patients for Virtual Visits (Telemedicine).  Patients are able to view lab/test results, encounter notes, upcoming appointments, etc.  Non-urgent messages can be sent to your provider as well.   To learn more about what you can do with MyChart, go to [email or web address].   Other Instructions -you can request to cancel your appointment and follow up as needed if you echocardiogram comes back normal

## 2023-08-15 NOTE — Progress Notes (Signed)
 Cardiology Office Note:    Date:  08/15/2023   ID:  Janet French, DOB 23-Nov-1985, MRN [id number]  PCP:  Freddrick No   [facility] Providers Cardiologist:  None     Referring MD: No ref. provider found   Chief Complaint  Patient presents with   Follow-up    New pt  has had some chest pain dull aching feeling of and on since FEB,  no chest pressure or SOB, medciation reviewed verbally with patient     History of Present Illness:    Janet French is a 38 y.o. female with no significant past medical history who presents due to chest pain and EKG abnormality.  States having symptoms of left-sided chest discomfort under her left breast ongoing over the past 4 months.  Symptoms are not associated with exertion.  Laying on her back sometimes causes symptoms.  She denies any personal or family history of heart attacks.  Denies smoking.  Saw her primary care physician 2 months ago, was told having at least complete right bundle branch block on EKG.  Denies any breathing issues.   Past Medical History:  Diagnosis Date   Vaginal Pap smear, abnormal     Past Surgical History:  Procedure Laterality Date   LAPAROSCOPIC TUBAL LIGATION Bilateral 07/16/2014   Procedure: LAPAROSCOPIC TUBAL LIGATION;  Surgeon: Delon Prude, DO;  Location: [facility];  Service: Gynecology;  Laterality: Bilateral;   wisdom teeth removal  2003   WISDOM TOOTH EXTRACTION      Current Medications: Current Meds  Medication Sig   Semaglutide -Weight Management (WEGOVY ) 1 MG/0.5ML SOAJ Inject 1 mg into the skin once a week.   Vitamin D , Ergocalciferol , (DRISDOL ) 1.25 MG (50000 UNIT) CAPS capsule Take 1 capsule (50,000 Units total) by mouth once a week.     Allergies:   Guaifenesin-codeine   Social History   Socioeconomic History   Marital status: Single    Spouse name: Not on file   Number of children: Not on file   Years of education: Not on file   Highest education level: Not on file  Occupational  History   Not on file  Tobacco Use   Smoking status: Never   Smokeless tobacco: Never  Vaping Use   Vaping status: Never Used  Substance and Sexual Activity   Alcohol use: No    Comment: occasionally   Drug use: No   Sexual activity: Yes    Birth control/protection: Surgical  Other Topics Concern   Not on file  Social History Narrative   Not on file   Social Drivers of Health   Financial Resource Strain: Low Risk  (12/08/2020)   Overall Financial Resource Strain (CARDIA)    Difficulty of Paying Living Expenses: Not very hard  Food Insecurity: No Food Insecurity (12/08/2020)   Hunger Vital Sign    Worried About Running Out of Food in the Last Year: Never true    Ran Out of Food in the Last Year: Never true  Transportation Needs: No Transportation Needs (12/08/2020)   PRAPARE - Administrator, Civil Service (Medical): No    Lack of Transportation (Non-Medical): No  Physical Activity: Insufficiently Active (12/08/2020)   Exercise Vital Sign    Days of Exercise per Week: 4 days    Minutes of Exercise per Session: 30 min  Stress: No Stress Concern Present (12/08/2020)   [employer] of Occupational Health - Occupational Stress Questionnaire    Feeling of Stress : Only  a little  Social Connections: Moderately Isolated (12/08/2020)   Social Connection and Isolation Panel    Frequency of Communication with Friends and Family: More than three times a week    Frequency of Social Gatherings with Friends and Family: Once a week    Attends Religious Services: Never    Database administrator or Organizations: Yes    Attends Engineer, structural: More than 4 times per year    Marital Status: Never married     Family History: The patient's family history includes Esophageal cancer in her maternal grandmother; Heart disease in her father; Hypertension in her father; Seizures in her father.  ROS:   Please see the history of present illness.     All other  systems reviewed and are negative.  EKGs/Labs/Other Studies Reviewed:    The following studies were reviewed today:  EKG Interpretation Date/Time:  Tuesday August 15 2023 09:12:25 EDT Ventricular Rate:  64 PR Interval:  166 QRS Duration:  96 QT Interval:  406 QTC Calculation: 418 R Axis:   -9  Text Interpretation: Normal sinus rhythm with sinus arrhythmia Incomplete right bundle branch block Confirmed by Darliss Rogue ([id number]) on 08/15/2023 9:23:24 AM    Recent Labs: No results found for requested labs within last 365 days.  Recent Lipid Panel No results found for: CHOL, TRIG, HDL, CHOLHDL, VLDL, LDLCALC, LDLDIRECT   Risk Assessment/Calculations:             Physical Exam:    VS:  BP 120/72 (BP Location: Left Arm, Patient Position: Sitting, Cuff Size: Normal)   Pulse 64   Ht 5' (1.524 m)   Wt 174 lb 3.2 oz (79 kg)   SpO2 99%   BMI 34.02 kg/m     Wt Readings from Last 3 Encounters:  08/15/23 174 lb 3.2 oz (79 kg)  12/08/20 168 lb 3.2 oz (76.3 kg)  10/16/15 160 lb (72.6 kg)     GEN:  Well nourished, well developed in no acute distress HEENT: Normal NECK: No JVD; No carotid bruits CARDIAC: RRR, no murmurs, rubs, gallops RESPIRATORY:  Clear to auscultation without rales, wheezing or rhonchi  ABDOMEN: Soft, non-tender, non-distended MUSCULOSKELETAL:  No edema; No deformity  SKIN: Warm and dry NEUROLOGIC:  Alert and oriented x 3 PSYCHIATRIC:  Normal affect   ASSESSMENT:    1. Chest pain of uncertain etiology   2. Incomplete RBBB    PLAN:    In order of problems listed above:  Chest pain, symptoms appear very atypical.  Patient is low risk from a cardiac perspective.  Will obtain echo to rule out any significant structural abnormalities.  No indication for ischemic workup at this time. Incomplete right bundle branch block on EKG, echocardiogram as above, clinically asymptomatic.  Follow-up after echo     Medication Adjustments/Labs and  Tests Ordered: Current medicines are reviewed at length with the patient today.  Concerns regarding medicines are outlined above.  Orders Placed This Encounter  Procedures   EKG 12-Lead   ECHOCARDIOGRAM COMPLETE   No orders of the defined types were placed in this encounter.   Patient Instructions  Medication Instructions:  Your physician recommends that you continue on your current medications as directed. Please refer to the Current Medication list given to you today.   *If you need a refill on your cardiac medications before your next appointment, please call your pharmacy*  Lab Work: No labs ordered today  If you have labs (blood work) drawn today  and your tests are completely normal, you will receive your results only by: MyChart Message (if you have MyChart) OR A paper copy in the mail If you have any lab test that is abnormal or we need to change your treatment, we will call you to review the results.  Testing/Procedures: Your physician has requested that you have an echocardiogram. Echocardiography is a painless test that uses sound waves to create images of your heart. It provides your doctor with information about the size and shape of your heart and how well your heart's chambers and valves are working.   You may receive an ultrasound enhancing agent through an IV if needed to better visualize your heart during the echo. This procedure takes approximately one hour.  There are no restrictions for this procedure.  This will take place at [address] [facility] [address]) [address], [place] [address]  Please note: We ask at that you not bring children with you during ultrasound (echo/ vascular) testing. Due to room size and safety concerns, children are not allowed in the ultrasound rooms during exams. Our front office staff cannot provide observation of children in our lobby area while testing is being conducted. An adult accompanying a patient to their appointment will  only be allowed in the ultrasound room at the discretion of the ultrasound technician under special circumstances. We apologize for any inconvenience.   Follow-Up: At [facility], you and your health needs are our priority.  As part of our continuing mission to provide you with exceptional heart care, our providers are all part of one team.  This team includes your primary Cardiologist (physician) and Advanced Practice Providers or APPs (Physician Assistants and Nurse Practitioners) who all work together to provide you with the care you need, when you need it.  Your next appointment:   2 month(s)  Provider:   You may see Dr. Darliss or one of the following Advanced Practice Providers on your designated Care Team:   Lonni Meager, NP Lesley Maffucci, PA-C Bernardino Bring, PA-C Cadence [place], PA-C Tylene Lunch, NP Barnie Hila, NP    We recommend signing up for the patient portal called MyChart.  Sign up information is provided on this After Visit Summary.  MyChart is used to connect with patients for Virtual Visits (Telemedicine).  Patients are able to view lab/test results, encounter notes, upcoming appointments, etc.  Non-urgent messages can be sent to your provider as well.   To learn more about what you can do with MyChart, go to [email or web address].   Other Instructions -you can request to cancel your appointment and follow up as needed if you echocardiogram comes back normal       Signed, Redell Darliss, MD  08/15/2023 11:27 AM    [facility]

## 2023-08-21 ENCOUNTER — Other Ambulatory Visit (HOSPITAL_COMMUNITY): Payer: Self-pay

## 2023-08-21 MED ORDER — WEGOVY 1.7 MG/0.75ML ~~LOC~~ SOAJ
1.7000 mg | SUBCUTANEOUS | 0 refills | Status: AC
  Filled 2023-08-21: qty 3, 28d supply, fill #0

## 2023-08-22 ENCOUNTER — Other Ambulatory Visit: Payer: Self-pay

## 2023-08-22 ENCOUNTER — Other Ambulatory Visit (HOSPITAL_COMMUNITY): Payer: Self-pay

## 2023-08-23 ENCOUNTER — Ambulatory Visit: Attending: Cardiology

## 2023-08-23 DIAGNOSIS — R079 Chest pain, unspecified: Secondary | ICD-10-CM

## 2023-08-23 LAB — ECHOCARDIOGRAM COMPLETE
AR max vel: 2.15 cm2
AV Area VTI: 1.94 cm2
AV Area mean vel: 2.05 cm2
AV Mean grad: 4 mmHg
AV Peak grad: 7 mmHg
Ao pk vel: 1.32 m/s
Area-P 1/2: 4.21 cm2
S' Lateral: 2.95 cm

## 2023-08-28 ENCOUNTER — Ambulatory Visit: Payer: Self-pay | Admitting: Cardiology

## 2023-08-28 ENCOUNTER — Ambulatory Visit: Payer: Self-pay | Admitting: Cardiovascular Disease

## 2023-09-20 ENCOUNTER — Other Ambulatory Visit (HOSPITAL_COMMUNITY): Payer: Self-pay

## 2023-09-20 MED ORDER — WEGOVY 2.4 MG/0.75ML ~~LOC~~ SOAJ
2.4000 mg | SUBCUTANEOUS | 0 refills | Status: DC
  Filled 2023-09-20: qty 3, 28d supply, fill #0

## 2023-10-16 ENCOUNTER — Ambulatory Visit: Admitting: Student

## 2023-10-20 ENCOUNTER — Other Ambulatory Visit (HOSPITAL_COMMUNITY): Payer: Self-pay

## 2023-10-20 ENCOUNTER — Other Ambulatory Visit: Payer: Self-pay

## 2023-10-24 ENCOUNTER — Other Ambulatory Visit (HOSPITAL_COMMUNITY): Payer: Self-pay

## 2023-10-24 ENCOUNTER — Encounter (HOSPITAL_COMMUNITY): Payer: Self-pay

## 2023-10-31 ENCOUNTER — Other Ambulatory Visit (HOSPITAL_COMMUNITY): Payer: Self-pay

## 2023-11-06 ENCOUNTER — Other Ambulatory Visit (HOSPITAL_COMMUNITY): Payer: Self-pay

## 2023-11-08 ENCOUNTER — Encounter (HOSPITAL_COMMUNITY): Payer: Self-pay

## 2023-11-08 ENCOUNTER — Other Ambulatory Visit (HOSPITAL_COMMUNITY): Payer: Self-pay

## 2023-11-08 ENCOUNTER — Other Ambulatory Visit: Payer: Self-pay

## 2023-11-08 MED ORDER — WEGOVY 2.4 MG/0.75ML ~~LOC~~ SOAJ
2.4000 mg | SUBCUTANEOUS | 0 refills | Status: DC
  Filled 2023-11-08: qty 3, 28d supply, fill #0

## 2023-11-09 ENCOUNTER — Other Ambulatory Visit (HOSPITAL_COMMUNITY): Payer: Self-pay

## 2023-11-23 ENCOUNTER — Other Ambulatory Visit (HOSPITAL_COMMUNITY): Payer: Self-pay

## 2023-11-23 MED ORDER — WEGOVY 2.4 MG/0.75ML ~~LOC~~ SOAJ
2.4000 mg | SUBCUTANEOUS | 0 refills | Status: DC
  Filled 2023-11-24 – 2023-12-04 (×3): qty 3, 28d supply, fill #0

## 2023-11-23 MED ORDER — PHENTERMINE HCL 37.5 MG PO TABS
37.5000 mg | ORAL_TABLET | Freq: Every day | ORAL | 0 refills | Status: DC
  Filled 2023-11-23: qty 30, 30d supply, fill #0

## 2023-11-24 ENCOUNTER — Other Ambulatory Visit (HOSPITAL_COMMUNITY): Payer: Self-pay

## 2023-11-30 ENCOUNTER — Other Ambulatory Visit (HOSPITAL_COMMUNITY): Payer: Self-pay

## 2023-12-04 ENCOUNTER — Other Ambulatory Visit (HOSPITAL_COMMUNITY): Payer: Self-pay

## 2023-12-26 ENCOUNTER — Other Ambulatory Visit (HOSPITAL_COMMUNITY): Payer: Self-pay

## 2023-12-26 ENCOUNTER — Other Ambulatory Visit: Payer: Self-pay

## 2023-12-26 MED ORDER — PHENTERMINE HCL 37.5 MG PO TABS
37.5000 mg | ORAL_TABLET | Freq: Every day | ORAL | 0 refills | Status: AC
  Filled 2023-12-26: qty 30, 30d supply, fill #0

## 2023-12-26 MED ORDER — WEGOVY 2.4 MG/0.75ML ~~LOC~~ SOAJ
2.4000 mg | SUBCUTANEOUS | 0 refills | Status: AC
  Filled 2024-01-01: qty 3, 28d supply, fill #0

## 2024-01-01 ENCOUNTER — Other Ambulatory Visit (HOSPITAL_COMMUNITY): Payer: Self-pay

## 2024-01-05 ENCOUNTER — Other Ambulatory Visit (HOSPITAL_COMMUNITY): Payer: Self-pay
